# Patient Record
Sex: Female | Born: 1960 | Race: White | Hispanic: No | Marital: Married | Smoking: Never smoker
Health system: Southern US, Community
[De-identification: ages and names within clinical notes are randomized; demographics above are authoritative.]

## PROBLEM LIST (undated history)

## (undated) DIAGNOSIS — Z87442 Personal history of urinary calculi: Secondary | ICD-10-CM

## (undated) DIAGNOSIS — Z8489 Family history of other specified conditions: Secondary | ICD-10-CM

## (undated) HISTORY — PX: TONSILLECTOMY: SUR1361

## (undated) HISTORY — DX: Personal history of urinary calculi: Z87.442

## (undated) HISTORY — PX: LIPOMA EXCISION: SHX5283

---

## 2005-08-19 ENCOUNTER — Ambulatory Visit: Payer: Self-pay | Admitting: Obstetrics and Gynecology

## 2005-08-23 HISTORY — PX: CHOLECYSTECTOMY: SHX55

## 2008-05-16 ENCOUNTER — Ambulatory Visit: Payer: Self-pay | Admitting: Obstetrics and Gynecology

## 2010-06-13 LAB — HM MAMMOGRAPHY: HM Mammogram: NORMAL

## 2010-09-13 LAB — HM PAP SMEAR: HM Pap smear: NORMAL

## 2010-09-15 ENCOUNTER — Ambulatory Visit: Payer: Self-pay | Admitting: Obstetrics and Gynecology

## 2012-01-11 ENCOUNTER — Encounter: Payer: Self-pay | Admitting: Internal Medicine

## 2012-01-11 ENCOUNTER — Telehealth: Payer: Self-pay | Admitting: Internal Medicine

## 2012-01-11 ENCOUNTER — Ambulatory Visit (INDEPENDENT_AMBULATORY_CARE_PROVIDER_SITE_OTHER): Payer: PRIVATE HEALTH INSURANCE | Admitting: Internal Medicine

## 2012-01-11 VITALS — BP 112/70 | HR 80 | Temp 98.2°F | Resp 16 | Ht 65.0 in | Wt 213.8 lb

## 2012-01-11 DIAGNOSIS — R635 Abnormal weight gain: Secondary | ICD-10-CM

## 2012-01-11 DIAGNOSIS — E669 Obesity, unspecified: Secondary | ICD-10-CM

## 2012-01-11 DIAGNOSIS — Z803 Family history of malignant neoplasm of breast: Secondary | ICD-10-CM

## 2012-01-11 NOTE — Telephone Encounter (Signed)
MAMMOGRAM APPOINTMENT Thursday 01/13/12 @ 9 PT A[place]

## 2012-01-11 NOTE — Progress Notes (Signed)
Patient ID: Amy Oliver, female   DOB: 1961-03-15, 51 y.o.   MRN: [id number]  Patient Active Problem List  Diagnoses  . History of nephrolithiasis  . Obesity (BMI 30-39.9)    Subjective:  CC:   Chief Complaint  Patient presents with  . New Patient    HPI:   Amy Oliver a 51 y.o. female who presents as a new patient.  She is transferring from [facility] , her prior gynecologist.   Last PAP was Jan 2012,  Mammogram was Nov 2011.  She is interested in continuing breast screening but is requesting a thermography. She has a FH of BRCA in paternal GM at age 63, and maternal great aunt who  died at 57 of massive MI. History of cholecystectomy around 2009 by Michela Pitcher secondary to cholecystitis.   2  c sections ,  No complications. Tonsillectomy .  Brother developed DM at age 25 secondary to a viral illness, He is not overweight.  Her CC is weight gain despite a healthy diet and exercise.  She is working out 3 days per week .  The workout is 10 minutes with weights 3 days per week.   Diet is irregular.  She skips breakfast a lot .  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 . Lunch is [place], low fat cheese and fruit  Dinner is a protein and vegetable. Evening snack is ice cream    Past Medical History  Diagnosis Date  . History of nephrolithiasis     Past Surgical History  Procedure Date  . Cholecystectomy 2007         The following portions of the patient's history were reviewed and updated as appropriate: Allergies, current medications, and problem list.    Review of Systems:   12 Pt  review of systems was negative except those addressed in the HPI,     History   Social History  . Marital Status: Married    Spouse Name: N/A    Number of Children: N/A  . Years of Education: N/A   Occupational History  . executive Nature conservation officer   Social History Main Topics  . Smoking status: Never Smoker   . Smokeless tobacco: Never Used  . Alcohol Use: Yes   . Drug Use: No  . Sexually Active: Not on file   Other Topics Concern  . Not on file   Social History Narrative  . No narrative on file    Objective:  BP 112/70  Pulse 80  Temp(Src) 98.2 F (36.8 C) (Oral)  Resp 16  Ht 5\' 5"  (1.651 m)  Wt 213 lb 12 oz (96.956 kg)  BMI 35.57 kg/m2  SpO2 96%  LMP 12/24/2011  General appearance: alert, cooperative and appears stated age Ears: normal TM's and external ear canals both ears Throat: lips, mucosa, and tongue normal; teeth and gums normal Neck: no adenopathy, no carotid bruit, supple, symmetrical, trachea midline and thyroid not enlarged, symmetric, no tenderness/mass/nodules Back: symmetric, no curvature. ROM normal. No CVA tenderness. Lungs: clear to auscultation bilaterally Heart: regular rate and rhythm, S1, S2 normal, no murmur, click, rub or gallop Abdomen: soft, non-tender; bowel sounds normal; no masses,  no organomegaly Pulses: 2+ and symmetric Skin: Skin color, texture, turgor normal. No rashes or lesions Lymph nodes: Cervical, supraclavicular, and axillary nodes normal.  Assessment and Plan:  Obesity (BMI 30-39.9) I spent 20 minutes addressing her BMI and recommended a low glycemic index diet utilizing smaller more  frequent meals to increase metabolism.  I have also recommended that patient start exercising with a goal of 30 minutes of aerobic exercise a minimum of 5 days per week. Screening for lipid disorders, thyroid and diabetes to be done today.      Updated Medication List Outpatient Encounter Prescriptions as of 01/11/2012  Medication Sig Dispense Refill  . b complex vitamins tablet Take 1 tablet by mouth daily.      . Cholecalciferol (BIO-D-MULSION FORTE PO) Take by mouth.      . FIBER DIET PO Take by mouth.      . Omega-3 Fatty Acids (OMEGA 3 PO) Take by mouth.         Orders Placed This Encounter  Procedures  . HM MAMMOGRAPHY  . TSH  . HM PAP SMEAR    No Follow-up on file.

## 2012-01-11 NOTE — Patient Instructions (Signed)
Consider the Low Glycemic Index Diet and 6 smaller meals daily :   7 AM Low carbohydrate Protein  Shakes (EAS Carb Control  Or Atkins ,  Available everywhere,   In  cases at BJs )  2.5 carbs  (Add or substitute a toasted sandwhich thin w/ peanut butter) or scramble eggs burrito  10 AM: Protein bar by Atkins (snack size,  Chocolate lover's variety at  BJ's)    Lunch: sandwich on pita bread or flatbread (Joseph's makes a pita bread and a flat bread , available at [employer] and [employer]J's; Toufayah makes a low carb flatbread available at [employer] and [employer])  Mission makes a CarB Control Whole Wheat tortilla  (6 net carbs,  26 g fiber,  BJ's)   3 PM:  Mid day :  Another protein bar,  Or a  cheese stick, 1/4 cup of almonds, walnuts, pistachios, pecans, peanuts,  Macadamia nuts  6 PM  Dinner:  "mean and green:"  Meat/chicken/fish, salad, and green veggie : use ranch, vinagrette,  Blue cheese, etc  9 PM snack : Breyer's low carb fudgiscle or  ice cream bar (Carb Smart) Weight Watcher's ice cream bar , or another protein shake  make your substitutions < 15 carbs per serving

## 2012-01-12 ENCOUNTER — Encounter: Payer: Self-pay | Admitting: Internal Medicine

## 2012-01-12 DIAGNOSIS — Z87442 Personal history of urinary calculi: Secondary | ICD-10-CM | POA: Insufficient documentation

## 2012-01-12 DIAGNOSIS — Z803 Family history of malignant neoplasm of breast: Secondary | ICD-10-CM | POA: Insufficient documentation

## 2012-01-12 DIAGNOSIS — E669 Obesity, unspecified: Secondary | ICD-10-CM | POA: Insufficient documentation

## 2012-01-12 NOTE — Assessment & Plan Note (Signed)
She has no first degree relatives of BRCA an dis curious about thermography as a screening device. Spent 20 minutes in revies of the available literature about theraography compared to accepted modalities/

## 2012-01-12 NOTE — Assessment & Plan Note (Addendum)
I spent 20 minutes addressing her BMI and recommended a low glycemic index diet utilizing smaller more frequent meals to increase metabolism.  I have also recommended that patient start exercising with a goal of 30 minutes of aerobic exercise a minimum of 5 days per week. Screening for lipid disorders, thyroid and diabetes to be done today.

## 2012-01-12 NOTE — Progress Notes (Signed)
Addended by: Duncan Dull on: 01/12/2012 09:21 PM   Modules accepted: Level of Service

## 2012-01-25 ENCOUNTER — Encounter: Payer: Self-pay | Admitting: Internal Medicine

## 2012-02-11 ENCOUNTER — Ambulatory Visit: Payer: PRIVATE HEALTH INSURANCE | Admitting: Internal Medicine

## 2012-03-03 ENCOUNTER — Ambulatory Visit (INDEPENDENT_AMBULATORY_CARE_PROVIDER_SITE_OTHER): Payer: PRIVATE HEALTH INSURANCE | Admitting: Internal Medicine

## 2012-03-03 ENCOUNTER — Encounter: Payer: Self-pay | Admitting: Internal Medicine

## 2012-03-03 VITALS — BP 120/68 | HR 80 | Temp 97.9°F | Resp 18 | Wt 215.0 lb

## 2012-03-03 DIAGNOSIS — E669 Obesity, unspecified: Secondary | ICD-10-CM

## 2012-03-03 LAB — HM MAMMOGRAPHY: HM Mammogram: NORMAL

## 2012-03-03 NOTE — Patient Instructions (Addendum)
Consider a Low Glycemic Index Diet and eating 6 smaller meals daily .  This frequent feeding stimulates your metabolism and the lower glycemic index foods will lower your blood sugars:   This is an example of my daily  "Low GI"  Diet:  All of the foods can be found at grocery stores and in bulk at BJs  club   7 AM Breakfast:  Low carbohydrate Protein  Shakes (I recommend the EAS AdvantEdge "Carb Control" shakes  Or the low carb shakes by Atkins.   Both are available everywhere:  In  cases at BJs  Or in 4 packs at grocery stores and pharmacies  2.5 carbs  (Alternative is  a toasted Arnold's Sandwhich Thin w/ peanut butter, a "Bagel Thin" with cream cheese and salmon) or  a scrambled egg burrito made with a low carb tortilla .  Avoid cereal and bananas, oatmeal too unless the old fashioned kind that takes 30-40 minutes to prepare.  the rest is overly processed, has minimal fiber, and loaded with carbohydrates!   10 AM: Protein bar by Atkins (the snack size, under 200 cal.  There are many varieties , available widely again or in bulk in limited varieties at BJs)  Other so called "protein bars" tend to be loaded with carbohydrates.  Remember, in food advertising, the word "energy" is synonymous for " carbohydrate."  Lunch: sandwich of turkey, (or any lunchmeat or canned tuna), fresh avocado and cheese on a lower carbohydrate pita bread, flatbread, or tortilla . Ok to use mayonnaise. The bread is the only source or carbohydrate that can be decreased (Joseph's makes a pita bread and a flat bread  Are 50 cal and 4 net carbs ; Toufayan makes a low carb flatbread 100 cal and 9 net carbs  and  Mission makes a low carb whole wheat tortilla  210 cal and 6 net carbs)  3 PM:  Mid day :  Another proteintttt bThe brear,  Or a  cheese stick (100 cal, 0 carbs),  Or 1 ounce of  almonds, walnuts, pistachios, pecans, peanuts,  Macadamia nuts. Or a Dannon light n Fit greek yogurt, 80 cal 8 net carbs . Avoid "granola"; the  dried cranberries and raisins are loaded with carbohydrates.    6 PM  Dinner:  "mean and green:"  Meat/chicken/fish or a high protein legume; , with a green salad, and a low GI  Veggie (broccoli, cauliflower, green beans, spinach, brussel sprouts. Lima beans) : Avoid "Low fat dressings, [place] and [place]! They are loaded with sugar! Instead use ranch, vinagrette,  Blue cheese, etc  9 PM snack : Breyer's "low carb" fudgsicle or  ice cream bar (Carb Smart line), or  Weight Watcher's ice cream bar , or anouther "no sugar added" ice cream; or another protein shake or a serving of fresh fruit with whipped cream (Avoid bananas, pineapple, grapes  and watermelon on a regular basis because they are high in sugar)   Remember that snack Substitutions should be less than 15 to 20 carbs  Per serving. Remember to subtract fiber grams to get the "net carbs." 

## 2012-03-05 ENCOUNTER — Encounter: Payer: Self-pay | Admitting: Internal Medicine

## 2012-03-05 NOTE — Assessment & Plan Note (Addendum)
Most of this visit was spent in counseling on management of her obesity. Her diet is actually not loaded with junk food or inappropriate foods. She has not tried eating 6 small meals a day which I advocated for increasing her metabolic activity. I also recommend increasing her exercise to 5 days a week. Given her rise in LDL and hemoglobin A1c I did recommend that she follow a low glycemic index diet

## 2012-03-05 NOTE — Progress Notes (Signed)
Patient ID: Amy Oliver, female   DOB: 1960/12/28, 51 y.o.   MRN: [id number] Follow up on weight gain/obesity identified at initial visit. She has no history of diabetes, hyperlipidemia or hypothyroidism.  She brought with her today an Excel spread sheet listing  various metabolic tests that were run initially in January 2012 and again in June.  Especially was prepared by her brother who is a Land who has recommended various natural supplements to her over the years to manage her metabolic syndrome.   A1c had increased from 5.5-5.7.   she had normal liver enzymes,  normal CBC,  normal iron studies. her LDL has increased from 159-172 and her HDL has dropped from 54-46.  triglycerides were fine at 117. Thyroid function is normal at 1.75 but her brother thinks that that is considered low T3 and T4 were fine. Recess were unnecessary and therefore just distracting. She has not lost any weight since her last visit. She has been on vacation during summer that time and therefore was not making any dietary changes. She is not exercising regularly.  she has no history of joint pain, depression, or eating disorders.

## 2012-06-02 ENCOUNTER — Encounter: Payer: PRIVATE HEALTH INSURANCE | Admitting: Internal Medicine

## 2012-06-02 ENCOUNTER — Ambulatory Visit: Payer: PRIVATE HEALTH INSURANCE | Admitting: Internal Medicine

## 2013-07-03 ENCOUNTER — Ambulatory Visit: Payer: Self-pay | Admitting: Internal Medicine

## 2013-07-05 ENCOUNTER — Telehealth: Payer: Self-pay | Admitting: Internal Medicine

## 2013-07-05 DIAGNOSIS — R921 Mammographic calcification found on diagnostic imaging of breast: Secondary | ICD-10-CM | POA: Insufficient documentation

## 2013-07-05 NOTE — Telephone Encounter (Signed)
[facility]  has reported that Her mammogram was abnormal on the right bc of clacifications. .   She will be contacted to get additional films and an ultrasound the facility. If she has not heard from them ny tomorrow, let us know

## 2013-07-06 NOTE — Telephone Encounter (Signed)
Left message for patient to return call to office. 

## 2013-07-09 NOTE — Telephone Encounter (Signed)
Left message for patient to return call to office. 

## 2013-07-09 NOTE — Telephone Encounter (Signed)
Pt aware that she has an abnormal mammogram on the R side due to calcifications. She is aware [facility] will contact her directly to schedule those views, and to call us if she hasn't heard anything soon.

## 2013-07-12 ENCOUNTER — Ambulatory Visit: Payer: Self-pay | Admitting: Internal Medicine

## 2013-07-17 ENCOUNTER — Telehealth: Payer: Self-pay | Admitting: Emergency Medicine

## 2013-07-17 NOTE — Telephone Encounter (Signed)
Auth # [id number] valid for only 07/23/13.

## 2013-07-23 ENCOUNTER — Ambulatory Visit: Payer: Self-pay | Admitting: Internal Medicine

## 2013-07-26 ENCOUNTER — Telehealth: Payer: Self-pay | Admitting: *Deleted

## 2013-07-26 ENCOUNTER — Other Ambulatory Visit: Payer: Self-pay | Admitting: Internal Medicine

## 2013-07-26 DIAGNOSIS — N6099 Unspecified benign mammary dysplasia of unspecified breast: Secondary | ICD-10-CM

## 2013-07-26 NOTE — Telephone Encounter (Signed)
patient returned call and is speaking with dr Darrick Huntsman.

## 2013-07-26 NOTE — Telephone Encounter (Signed)
Radiologist from [facility] called to report pt's path report: Right breast biopsy, focal atypical ductal hyperplasia. Will need surgical referral. Have not received faxed report yet,

## 2013-07-26 NOTE — Telephone Encounter (Signed)
Left message on patient's cell line about needing to discuss path report and arrange surgical referral.  My preference will be [facility] Glimpse.

## 2013-07-27 ENCOUNTER — Telehealth: Payer: Self-pay | Admitting: Internal Medicine

## 2013-07-27 NOTE — Telephone Encounter (Signed)
Patient was imaged and biopsied same day by [facility].  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

## 2013-07-31 ENCOUNTER — Ambulatory Visit: Payer: BC Managed Care – PPO

## 2013-07-31 ENCOUNTER — Encounter: Payer: Self-pay | Admitting: General Surgery

## 2013-07-31 ENCOUNTER — Ambulatory Visit (INDEPENDENT_AMBULATORY_CARE_PROVIDER_SITE_OTHER): Payer: BC Managed Care – PPO | Admitting: General Surgery

## 2013-07-31 VITALS — BP 122/68 | HR 84 | Resp 16 | Ht 66.0 in | Wt 216.0 lb

## 2013-07-31 DIAGNOSIS — Z9889 Other specified postprocedural states: Secondary | ICD-10-CM | POA: Insufficient documentation

## 2013-07-31 DIAGNOSIS — N6099 Unspecified benign mammary dysplasia of unspecified breast: Secondary | ICD-10-CM | POA: Insufficient documentation

## 2013-07-31 DIAGNOSIS — R928 Other abnormal and inconclusive findings on diagnostic imaging of breast: Secondary | ICD-10-CM

## 2013-07-31 DIAGNOSIS — N62 Hypertrophy of breast: Secondary | ICD-10-CM

## 2013-07-31 NOTE — Progress Notes (Signed)
Patient ID: Amy Oliver, female   DOB: 1961/07/13, 52 y.o.   MRN: [id number]  Chief Complaint  Patient presents with  . Other    abnormal mammogram    HPI Amy Oliver is a 52 y.o. female who presents for a breast evaluation. The most recent mammogram, ultrasound, and breast biopsy was done on 07/26/13 at [facility]. Patient does not perform regular self breast checks but does get regular mammograms done.  The patient denies any problems with her breasts at this time.    HPI  Past Medical History  Diagnosis Date  . History of nephrolithiasis     Past Surgical History  Procedure Laterality Date  . Cholecystectomy  2007  . Tonsillectomy    . Cesarean section      2    Family History  Problem Relation Age of Onset  . Hypertension Father   . Pulmonary fibrosis Father   . Diabetes Brother   . Lung cancer Paternal Aunt   . Breast cancer Paternal Grandmother   . Breast cancer Maternal Aunt     Social History History  Substance Use Topics  . Smoking status: Never Smoker   . Smokeless tobacco: Never Used  . Alcohol Use: Yes    Allergies  Allergen Reactions  . Penicillins Rash    Current Outpatient Prescriptions  Medication Sig Dispense Refill  . Difluprednate (DUREZOL) 0.05 % EMUL Apply to eye.      Marland Kitchen VITAMIN D, ERGOCALCIFEROL, PO Take by mouth.       No current facility-administered medications for this visit.    Review of Systems Review of Systems  Constitutional: Negative.   Respiratory: Negative.   Cardiovascular: Negative.     Blood pressure 122/68, pulse 84, resp. rate 16, height 5\' 6"  (1.676 m), weight 216 lb (97.977 kg), last menstrual period 07/04/2013.  Physical Exam Physical Exam  Constitutional: She is oriented to person, place, and time. She appears well-developed and well-nourished.  Neck: No thyromegaly present.  Cardiovascular: Normal rate, regular rhythm and normal heart sounds.   Pulmonary/Chest: Effort normal and breath sounds  normal. Right breast exhibits no inverted nipple, no mass, no nipple discharge, no skin change and no tenderness. Left breast exhibits no inverted nipple, no mass, no nipple discharge, no skin change and no tenderness.  Lymphadenopathy:    She has no cervical adenopathy.    She has no axillary adenopathy.  Neurological: She is alert and oriented to person, place, and time.  Skin: Skin is warm and dry.    Data Reviewed Screening mammograms on July 04, 2011 suggested a new density in the upper quadrant of the right breast for which additional views were requested. BI-RAD-0.  Focal spot compression views of the right breast is July 12, 2013 showed a focal area of increased microcalcifications in the right breast. BI-RAD-4.  July 23, 2013 stereotactic biopsy completed by the radiologist was completed. Biopsy showed atypical hyperplasia.  Focal ultrasound examination of the right breast completed today shows a superficially located 0.7 x 0.7 x 0.8 ill-defined hypoechoic area with evidence of a biopsy clip in the 10:00 position of the right breast 7 cm from the nipple.  Assessment    Atypical hyperplasia of the right breast.     Plan    The 1/6 chance of an additional lesion being identified on wide excision (DCIS which were likely than invasive malignancy) was reviewed with the patient. Indication for wide local excision was discussed. This can be completed under ultrasound  guidance in the next few weeks, otherwise needle localization will be required.  The risks of surgery including those related to bleeding and infection were discussed.  The patient is interested in alternative medicine, but is amenable to proceed to wide local excision.     Patient's surgery has been scheduled for 08-09-13 at [facility].   Amy Oliver 07/31/2013, 9:13 PM

## 2013-07-31 NOTE — Patient Instructions (Addendum)
Patient to be scheduled for right breast excision.   This patient's surgery has been scheduled for 08-09-13 at [facility].

## 2013-08-01 ENCOUNTER — Encounter: Payer: Self-pay | Admitting: Internal Medicine

## 2013-08-06 ENCOUNTER — Encounter: Payer: Self-pay | Admitting: Internal Medicine

## 2013-08-08 ENCOUNTER — Encounter: Payer: Self-pay | Admitting: Internal Medicine

## 2013-08-08 ENCOUNTER — Other Ambulatory Visit: Payer: Self-pay | Admitting: General Surgery

## 2013-08-08 DIAGNOSIS — N6099 Unspecified benign mammary dysplasia of unspecified breast: Secondary | ICD-10-CM

## 2013-08-09 ENCOUNTER — Ambulatory Visit: Payer: Self-pay | Admitting: General Surgery

## 2013-08-09 DIAGNOSIS — N6089 Other benign mammary dysplasias of unspecified breast: Secondary | ICD-10-CM

## 2013-08-09 HISTORY — PX: BREAST SURGERY: SHX581

## 2013-08-13 ENCOUNTER — Encounter: Payer: Self-pay | Admitting: General Surgery

## 2013-08-14 ENCOUNTER — Encounter: Payer: Self-pay | Admitting: General Surgery

## 2013-08-14 ENCOUNTER — Telehealth: Payer: Self-pay | Admitting: General Surgery

## 2013-08-14 NOTE — Telephone Encounter (Signed)
The pathology report from the patient's 08/09/2013 biopsy became available today. In both sections of breast tissue removed a foci of atypical ductal hyperplasia with changes consistent with needle tracts were identified. The margins were clear. One 2 mm inframammary node was included in the specimen. The biopsy site and clip had not been removed at the time of the procedure, and the patient and her husband were aware of this.  The finding of 2 additional microscopic foci of ADH makes it important to confirm that the original area identified on mammographic imaging does not represent a more sinister process. The plan is for needle localization and excision she is comfortable post biopsy has been discussed.  The patient will return on August 22, 2013 for her postop exam and to review options for management.

## 2013-08-22 ENCOUNTER — Encounter: Payer: Self-pay | Admitting: General Surgery

## 2013-08-22 ENCOUNTER — Ambulatory Visit (INDEPENDENT_AMBULATORY_CARE_PROVIDER_SITE_OTHER): Payer: BC Managed Care – PPO | Admitting: General Surgery

## 2013-08-22 VITALS — BP 126/74 | HR 84 | Resp 14 | Ht 66.0 in | Wt 212.0 lb

## 2013-08-22 DIAGNOSIS — N6099 Unspecified benign mammary dysplasia of unspecified breast: Secondary | ICD-10-CM

## 2013-08-22 DIAGNOSIS — N62 Hypertrophy of breast: Secondary | ICD-10-CM

## 2013-08-22 NOTE — Patient Instructions (Signed)
Follow up appointment to be announced.  

## 2013-08-22 NOTE — Progress Notes (Signed)
Patient ID: Amy Oliver, female   DOB: 06/10/1961, 52 y.o.   MRN: [id number]  Chief Complaint  Patient presents with  . Routine Post Op    right breast biopsy    HPI Amy Oliver is a 52 y.o. female here today for her post op right breast biopsy done on 08/09/13. The patient is accompanied by her husband. She reports a little discomfort post biopsy. She had previously been notified of the biopsy results.  HPI  Past Medical History  Diagnosis Date  . History of nephrolithiasis     Past Surgical History  Procedure Laterality Date  . Cholecystectomy  2007  . Tonsillectomy    . Cesarean section      2    Family History  Problem Relation Age of Onset  . Hypertension Father   . Pulmonary fibrosis Father   . Diabetes Brother   . Lung cancer Paternal Aunt   . Breast cancer Paternal Grandmother   . Breast cancer Maternal Aunt     Social History History  Substance Use Topics  . Smoking status: Never Smoker   . Smokeless tobacco: Never Used  . Alcohol Use: Yes    Allergies  Allergen Reactions  . Penicillins Rash    Current Outpatient Prescriptions  Medication Sig Dispense Refill  . Difluprednate (DUREZOL) 0.05 % EMUL Apply to eye.      Marland Kitchen VITAMIN D, ERGOCALCIFEROL, PO Take by mouth.       No current facility-administered medications for this visit.    Review of Systems Review of Systems  Constitutional: Negative.   Respiratory: Negative.   Cardiovascular: Negative.     Blood pressure 126/74, pulse 84, resp. rate 14, height 5\' 6"  (1.676 m), weight 212 lb (96.163 kg), last menstrual period 07/04/2013.  Physical Exam Physical Exam  Constitutional: She is oriented to person, place, and time. She appears well-developed and well-nourished.  Eyes: No scleral icterus.  Cardiovascular: Normal rate, regular rhythm and normal heart sounds.   Pulmonary/Chest: Breath sounds normal. Right breast exhibits no inverted nipple, no mass, no nipple discharge, no  skin change and no tenderness.  Right breast incision looks clean and healing well.  Abdominal: Bowel sounds are normal.  Lymphadenopathy:    She has no cervical adenopathy.    She has no axillary adenopathy.  Neurological: She is alert and oriented to person, place, and time.  Skin: Skin is warm and dry.    Data Reviewed Original biopsy showed ADH. 2 samples of breast parenchyma were removed at the time of her operative biopsy, each showed foci of ADH. The original biopsy site was not included in the resected tissue.  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     Plan    The patient will require needle localization and biopsy of the original site to confirm no upstaging the DCIS. If ADH alone as identified breast MRI in 6 months will be recommended to confirm no other areas not obvious on mammogram are of concern. (2 areas removed at operative biopsy had not shown any associated mammographic abnormality on retrospective review of the films.)   If DCIS or invasive cancer is identified, it will require at least a minimum of 2 months for inflammatory changes related to the biopsy process to resolve to minimize obscuring any small areas on MRI imaging.  The patient is aware that the original biopsy site was not removed, and this was because what I thought represented the biopsy  cavity an ultrasound did not correlate with the operative findings. The opportunity to have her care transferred to another physician locally or at the [facility] (she has a relative who works at [facility]) was discussed.  One she is comfortable enough to tolerate a repeat mammogram, will arrange for needle localization and reexcision.       Amy Oliver 08/24/2013, 3:33 PM

## 2013-09-03 LAB — PATHOLOGY REPORT

## 2013-09-07 ENCOUNTER — Telehealth: Payer: Self-pay | Admitting: Internal Medicine

## 2013-09-11 ENCOUNTER — Other Ambulatory Visit (HOSPITAL_COMMUNITY)
Admission: RE | Admit: 2013-09-11 | Discharge: 2013-09-11 | Disposition: A | Discharge status: Home / Self Care | Payer: BC Managed Care – PPO | Source: Ambulatory Visit | Attending: Internal Medicine | Admitting: Internal Medicine

## 2013-09-11 ENCOUNTER — Ambulatory Visit (INDEPENDENT_AMBULATORY_CARE_PROVIDER_SITE_OTHER): Payer: BC Managed Care – PPO | Admitting: Internal Medicine

## 2013-09-11 ENCOUNTER — Encounter: Payer: Self-pay | Admitting: Internal Medicine

## 2013-09-11 VITALS — BP 124/76 | HR 93 | Temp 98.5°F | Resp 16 | Ht 64.5 in | Wt 212.5 lb

## 2013-09-11 DIAGNOSIS — R921 Mammographic calcification found on diagnostic imaging of breast: Secondary | ICD-10-CM

## 2013-09-11 DIAGNOSIS — Z9889 Other specified postprocedural states: Secondary | ICD-10-CM

## 2013-09-11 DIAGNOSIS — H2000 Unspecified acute and subacute iridocyclitis: Secondary | ICD-10-CM

## 2013-09-11 DIAGNOSIS — E785 Hyperlipidemia, unspecified: Secondary | ICD-10-CM

## 2013-09-11 DIAGNOSIS — Z01419 Encounter for gynecological examination (general) (routine) without abnormal findings: Secondary | ICD-10-CM | POA: Insufficient documentation

## 2013-09-11 DIAGNOSIS — Z124 Encounter for screening for malignant neoplasm of cervix: Secondary | ICD-10-CM

## 2013-09-11 DIAGNOSIS — Z0000 Encounter for general adult medical examination without abnormal findings: Secondary | ICD-10-CM | POA: Insufficient documentation

## 2013-09-11 DIAGNOSIS — R928 Other abnormal and inconclusive findings on diagnostic imaging of breast: Secondary | ICD-10-CM

## 2013-09-11 DIAGNOSIS — E669 Obesity, unspecified: Secondary | ICD-10-CM

## 2013-09-11 DIAGNOSIS — Z1211 Encounter for screening for malignant neoplasm of colon: Secondary | ICD-10-CM

## 2013-09-11 DIAGNOSIS — Z1151 Encounter for screening for human papillomavirus (HPV): Secondary | ICD-10-CM | POA: Insufficient documentation

## 2013-09-11 NOTE — Progress Notes (Signed)
Patient ID: Amy Oliver, female   DOB: 03-Jul-1961, 53 y.o.   MRN: [id number]       Subjective:     Amy Oliver is a 53 y.o. female and is here for a comprehensive physical exam. The patient reports several problems.  History   Social History  . Marital Status: Married    Spouse Name: N/A    Number of Children: N/A  . Years of Education: N/A   Occupational History  . executive Insurance underwriter   Social History Main Topics  . Smoking status: Never Smoker   . Smokeless tobacco: Never Used  . Alcohol Use: Yes  . Drug Use: No  . Sexual Activity: Not on file   Other Topics Concern  . Not on file   Social History Narrative  . No narrative on file   Health Maintenance  Topic Date Due  . Tetanus/tdap  05/03/1982  . Influenza Vaccine  03/23/2013  . Colonoscopy  05/03/2013  . Pap Smear  09/13/2013  . Mammogram  07/13/2015    The following portions of the patient's history were reviewed and updated as appropriate: allergies, current medications, past family history, past medical history, past social history, past surgical history and problem list.  Review of Systems A comprehensive review of systems was negative.   Objective:   BP 124/76  Pulse 93  Temp(Src) 98.5 F (36.9 C) (Oral)  Resp 16  Ht 5' 4.5" (1.638 m)  Wt 212 lb 8 oz (96.389 kg)  BMI 35.93 kg/m2  SpO2 98%  LMP 09/01/2013  General Appearance:    Alert, cooperative, no distress, appears stated age  Head:    Normocephalic, without obvious abnormality, atraumatic  Eyes:    PERRL, conjunctiva/corneas clear, EOM's intact, fundi    benign, both eyes  Ears:    Normal TM's and external ear canals, both ears  Nose:   Nares normal, septum midline, mucosa normal, no drainage    or sinus tenderness  Throat:   Lips, mucosa, and tongue normal; teeth and gums normal  Neck:   Supple, symmetrical, trachea midline, no adenopathy;    thyroid:  no enlargement/tenderness/nodules; no  carotid   bruit or JVD  Back:     Symmetric, no curvature, ROM normal, no CVA tenderness  Lungs:     Clear to auscultation bilaterally, respirations unlabored  Chest Wall:    No tenderness or deformity   Heart:    Regular rate and rhythm, S1 and S2 normal, no murmur, rub   or gallop  Breast Exam:    Right breast incision 99% epithelialized,  With granulating tissue at inferolateral edge.  Breast is tender to palpation.  No   masses, or nipple abnormality  Abdomen:     Soft, non-tender, bowel sounds active all four quadrants,    no masses, no organomegaly  Genitalia:    Normal female without lesion.  Thick white vaginal discharge noted ,  No CMT or andexal tenderenor tenderness  Rectal:    deferred    Extremities:   Extremities normal, atraumatic, no cyanosis or edema  Pulses:   2+ and symmetric all extremities  Skin:   Skin color, texture, turgor normal, no rashes or lesions  Lymph nodes:   Cervical, supraclavicular, and axillary nodes normal  Neurologic:   CNII-XII intact, normal strength, sensation and reflexes    throughout    Assessment and Plan:   Breast calcifications on mammogram She underwent breast biopsy of  atypical calcifications in the right breast but will need a second biopsy to target the original calcs seen on mammogram.  She is having a second eval at [facility] per sister's request in 2 weeks.    S/P breast biopsy, right She is having serous drainage without erythema from the inferolateral border of prior biopsy site, the area is granulating but not epithelialized .  Does not appear infected.  Will consider 7 days of keflex   Encounter for preventive health examination Annual comprehensive exam was done including breast, pelvic and PAP smear. All screenings have been addressed .given her ongoing breast issues, she has opted for FOBTS in lieu of screening colonoscopy this year.   Obesity (BMI 30-39.9) I have addressed  BMI and recommended wt loss of 10% of body weigh over the  next 6 months using a low glycemic index diet and regular exercise a minimum of 5 days per week.    HLA-B27 associated acute anterior uveitis Suspected by recent outside labs.. Records from [facility] requested/    Updated Medication List Outpatient Encounter Prescriptions as of 09/11/2013  Medication Sig  . Cholecalciferol (BIO-D-MULSION PO) Take 2 drops by mouth daily.  . Difluprednate (DUREZOL) 0.05 % EMUL Apply to eye.  Marland Kitchen VITAMIN D, ERGOCALCIFEROL, PO Take by mouth.

## 2013-09-11 NOTE — Assessment & Plan Note (Signed)
She is having serous drainage without erythema from the inferolateral border of prior biopsy site, the area is granulating but not epithelialized .  Does not appear infected.  Will consider 7 days of keflex

## 2013-09-11 NOTE — Assessment & Plan Note (Signed)
Suspected by recent outside labs.. Records from [facility] requested/

## 2013-09-11 NOTE — Assessment & Plan Note (Signed)
Annual comprehensive exam was done including breast, pelvic and PAP smear. All screenings have been addressed .given her ongoing breast issues, she has opted for FOBTS in lieu of screening colonoscopy this year.

## 2013-09-11 NOTE — Assessment & Plan Note (Signed)
I have addressed  BMI and recommended wt loss of 10% of body weigh over the next 6 months using a low glycemic index diet and regular exercise a minimum of 5 days per week.   

## 2013-09-11 NOTE — Assessment & Plan Note (Signed)
She underwent breast biopsy of atypical calcifications in the right breast but will need a second biopsy to target the original calcs seen on mammogram.  She is having a second eval at [facility] per sister's request in 2 weeks.

## 2013-09-12 ENCOUNTER — Telehealth: Payer: Self-pay | Admitting: *Deleted

## 2013-09-12 ENCOUNTER — Ambulatory Visit (INDEPENDENT_AMBULATORY_CARE_PROVIDER_SITE_OTHER): Payer: BC Managed Care – PPO | Admitting: *Deleted

## 2013-09-12 DIAGNOSIS — N62 Hypertrophy of breast: Secondary | ICD-10-CM

## 2013-09-12 DIAGNOSIS — N6099 Unspecified benign mammary dysplasia of unspecified breast: Secondary | ICD-10-CM

## 2013-09-12 LAB — WET PREP BY MOLECULAR PROBE
Candida species: NEGATIVE
Gardnerella vaginalis: NEGATIVE
TRICHOMONAS VAG: NEGATIVE

## 2013-09-12 NOTE — Telephone Encounter (Signed)
Message copied by Carson Myrtle on Wed Sep 12, 2013 10:21 AM ------      Message from: Erwin, Ulster W      Created: Wed Sep 12, 2013  8:28 AM       Notify the patient I received a note from Dr. Derrel Nip about some wound healing issues. Glad to look at it today if she desires.      If she is not interested, that is fine.  She is going to [facility] for an second opinion re: repeat biopsy, ask her to please have them send a copy of their evaluation to me.  ------

## 2013-09-12 NOTE — Telephone Encounter (Signed)
appt 200 with nurse

## 2013-09-12 NOTE — Progress Notes (Signed)
States there is a oozing area right breast biopsy site from 08-08-13.  States it doesn't happen daily, and it seems to be clear/tan. She states her nipple is tender/sensitive. [facility] appt is for Monday 09-17-13.

## 2013-09-12 NOTE — Patient Instructions (Addendum)
Continue self breast exams. Call office for any new breast issues or concerns. Use heating pad as needed for comfort. Continue to wear bra as needed for comfort.

## 2013-09-14 ENCOUNTER — Encounter: Payer: Self-pay | Admitting: *Deleted

## 2013-09-28 ENCOUNTER — Ambulatory Visit (INDEPENDENT_AMBULATORY_CARE_PROVIDER_SITE_OTHER): Payer: BC Managed Care – PPO | Admitting: *Deleted

## 2013-09-28 ENCOUNTER — Other Ambulatory Visit (INDEPENDENT_AMBULATORY_CARE_PROVIDER_SITE_OTHER): Payer: BC Managed Care – PPO

## 2013-09-28 DIAGNOSIS — Z23 Encounter for immunization: Secondary | ICD-10-CM

## 2013-09-28 DIAGNOSIS — E785 Hyperlipidemia, unspecified: Secondary | ICD-10-CM

## 2013-09-28 LAB — LIPID PANEL
CHOL/HDL RATIO: 7
CHOLESTEROL: 275 mg/dL — AB (ref 0–200)
HDL: 40.3 mg/dL (ref >39.00)
TRIGLYCERIDES: 117 mg/dL (ref 0.0–149.0)
VLDL: 23.4 mg/dL (ref 0.0–40.0)

## 2013-09-28 LAB — COMPREHENSIVE METABOLIC PANEL
ALT: 16 U/L (ref 0–35)
AST: 19 U/L (ref 0–37)
Albumin: 4 g/dL (ref 3.5–5.2)
Alkaline Phosphatase: 61 U/L (ref 39–117)
BUN: 13 mg/dL (ref 6–23)
CALCIUM: 9.2 mg/dL (ref 8.4–10.5)
CO2: 26 mEq/L (ref 19–32)
CREATININE: 0.8 mg/dL (ref 0.4–1.2)
Chloride: 107 mEq/L (ref 96–112)
GFR: 77.21 mL/min (ref >60.00)
Glucose, Bld: 89 mg/dL (ref 70–99)
Potassium: 4.5 mEq/L (ref 3.5–5.1)
Sodium: 138 mEq/L (ref 135–145)
TOTAL PROTEIN: 7.3 g/dL (ref 6.0–8.3)
Total Bilirubin: 0.7 mg/dL (ref 0.3–1.2)

## 2013-09-28 LAB — TSH: TSH: 1.13 u[IU]/mL (ref 0.35–5.50)

## 2013-10-01 LAB — LDL CHOLESTEROL, DIRECT: Direct LDL: 226.5 mg/dL

## 2013-10-02 ENCOUNTER — Encounter: Payer: Self-pay | Admitting: *Deleted

## 2013-10-09 ENCOUNTER — Telehealth: Payer: Self-pay | Admitting: *Deleted

## 2013-10-09 NOTE — Telephone Encounter (Signed)
Message copied by Carson Myrtle on Tue Oct 09, 2013 12:52 PM ------      Message from: Robert Bellow      Created: Fri Oct 05, 2013 11:47 AM       Patient was to be seen at [facility] on Jan 26th.. Interested to know what they recommended.  ------

## 2013-10-09 NOTE — Telephone Encounter (Signed)
Events related to her recent [facility] evaluation were discussed.  Repeat mammograms completed at that facility did not show residual calcifications. (Calcifications were the original indication for biopsy completed by the radiology service at [facility]).  The patient was advised to tamoxifen is chemoprevention would be appropriate, and the physician who evaluated the patient [facility] did not feel that reexcision to excise the previously placed clip was necessary at this time.  The patient is yet to fill her prescription. She is premenopausal. 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  At present the patient is scheduled for bilateral mammograms in 6 months at [facility]. She was encouraged to contact the facility so that the procedure/mammograms could be completed after local outpatient facility avoiding an unnecessary trip to [facility].  The patient will contact this office if we can be of any assistance.

## 2013-10-09 NOTE — Telephone Encounter (Signed)
I talked with the patient and she said that they did a repeat mammogram, clip remains, no additional surgery needed.  Recommend repeat mammogram in 6 months and tamoxifen. Her risk was calculated to be 21%.  She gave them our card and would like for Dr Bary Castilla to review the repeat mammogram that was done. Dr Bary Castilla was able to speak to patient on the phone as well.

## 2014-05-02 LAB — LIPID PANEL
CHOLESTEROL: 244 mg/dL — AB (ref 0–200)
HDL: 41 mg/dL (ref 35–70)
LDL Cholesterol: 170 mg/dL
Triglycerides: 113 mg/dL (ref 40–160)

## 2014-05-02 LAB — BASIC METABOLIC PANEL
BUN: 14 mg/dL (ref 4–21)
CREATININE: 0.9 mg/dL (ref 0.5–1.1)
Potassium: 5 mmol/L (ref 3.4–5.3)
Sodium: 140 mmol/L (ref 137–147)

## 2014-05-02 LAB — CBC AND DIFFERENTIAL
HEMATOCRIT: 42 % (ref 36–46)
Hemoglobin: 13.7 g/dL (ref 12.0–16.0)
PLATELETS: 389 10*3/uL (ref 150–399)
WBC: 6.6 10*3/mL

## 2014-05-02 LAB — HEPATIC FUNCTION PANEL
ALK PHOS: 59 U/L (ref 25–125)
ALT: 10 U/L (ref 7–35)
AST: 14 U/L (ref 13–35)
Bilirubin, Total: 0.3 mg/dL

## 2014-05-02 LAB — TSH: TSH: 2.6 u[IU]/mL (ref 0.41–5.90)

## 2014-05-05 ENCOUNTER — Telehealth: Payer: Self-pay | Admitting: Internal Medicine

## 2014-05-05 NOTE — Telephone Encounter (Signed)
Your vitamin D, thyroid , liver and kidney function are normal.  Your cholesterol is elevated,  LDL is 170,  Other components of lipid profile are fine  .   New ACC guidelines recommend starting patients aged 51 or higher on moderate intensity statin therapy for LDL between 70-189 and 10 yr risk of CAD > 7.5% ;  Please make appt to discuss   Regards,   Dr. Derrel Nip

## 2014-05-07 ENCOUNTER — Encounter: Payer: Self-pay | Admitting: *Deleted

## 2014-05-07 NOTE — Telephone Encounter (Signed)
Letter mailed

## 2014-06-24 ENCOUNTER — Encounter: Payer: Self-pay | Admitting: Internal Medicine

## 2014-09-21 ENCOUNTER — Encounter: Payer: Self-pay | Admitting: General Surgery

## 2014-09-21 NOTE — Progress Notes (Signed)
The patient has had follow-up mammograms completed through the [facility] system, the most recent bilateral exam in 07/22/2014. No new microcalcifications. BI-RADS-2.  Oh record of whether the patient was treated with antiestrogen therapy as would typically be indicated with a high risk lesion such as ADH.

## 2014-12-13 NOTE — Op Note (Signed)
PATIENT NAME:  Amy Oliver, Amy Oliver MR#:  [id number] DATE OF BIRTH:  1961-01-04  DATE OF PROCEDURE:  08/09/2013  DATE OF PROCEDURE: 08/09/2013.   PREOPERATIVE DIAGNOSIS: Atypical ductal hyperplasia of right breast.   POSTOPERATIVE DIAGNOSIS:  Atypical ductal hyperplasia of right breast.  OPERATIVE PROCEDURE: Right breast biopsy.   SURGEON: Hervey Ard.   ANESTHESIA:  General by LMA, Marcaine 0.5% with 1:200,000 units of epinephrine, 20 mL local infiltration.   ESTIMATED BLOOD LOSS: 10 mL.   CLINICAL NOTE: This 54 year old woman underwent a stereotactic biopsy for a mammographic abnormality suggesting atypical ductal hyperplasia. An office ultrasound had suggested that the previously placed biopsy clip was clearly evident in the 10 o'clock position of the left breast, 7 cm from the nipple, and she was felt to be a candidate for ultrasound-guided biopsy.   OPERATIVE NOTE: The patient under adequate general anesthesia. The area was prepped with ChloraPrep and draped. Marcaine was infiltrated for postoperative analgesia. Ultrasound was brought onto the field in a sterile fashion, in the previously identified location below the biopsy entry site was identified, and the biopsy tract to be followed down to the adipose tissue. A smooth, curvilinear incision in the upper outer quadrant of the breast was carried down at the skin and subcutaneous tissue after instillation of local anesthetic. The skin was incised sharply and the remaining dissection completed with electrocautery. Ultrasound showed a fairly superficial location, and beginning about 1 cm below the skin, near the junction of the adipose tissue and the underlying breast parenchyma, a 3 x 4 x 4 cm block of tissue extending down to the pectoralis fascia was excised. Specimen radiograph did not confirm the previously placed clip. An additional 2 cm section medial to this biopsy was excised, and again specimen radiograph was negative. At this point,  it was elected to discontinue further resection and to await for permanent pathology and likely return to the operating room with needle localization of the previously placed clip. Hemostasis was with electrocautery. The tissue was approximated in layers with interrupted 2-0 Vicryl figure-of-eight sutures. The skin was closed with a running 4-0 Vicryl subcuticular suture. Benzoin and Steri-Strips were applied, followed by fluff gauze, Kerlix and an Ace wrap.   The patient and her husband were both advised that the biopsy clip had not been removed at the time of the procedure, and that further decisions regarding management will take place after final pathology is received.     ____________________________ Robert Bellow, MD jwb:dmm D: 08/09/2013 20:11:11 ET T: 08/09/2013 20:33:25 ET JOB#: [id number]  cc: Robert Bellow, MD, <Dictator> Deborra Medina, MD Deirdra Heumann Amedeo Kinsman MD ELECTRONICALLY SIGNED 08/10/2013 22:10

## 2015-07-06 IMAGING — MG MM DIGITAL SCREENING BILAT W/ CAD
1 series · 5 of 5 positions shown · non-contrast
Comparison: Previous Exam(s)

CLINICAL DATA: Screening.

EXAM:
DIGITAL SCREENING BILATERAL MAMMOGRAM WITH CAD

[R CC · right · 5 of 5 slices shown]
[im 1/5]
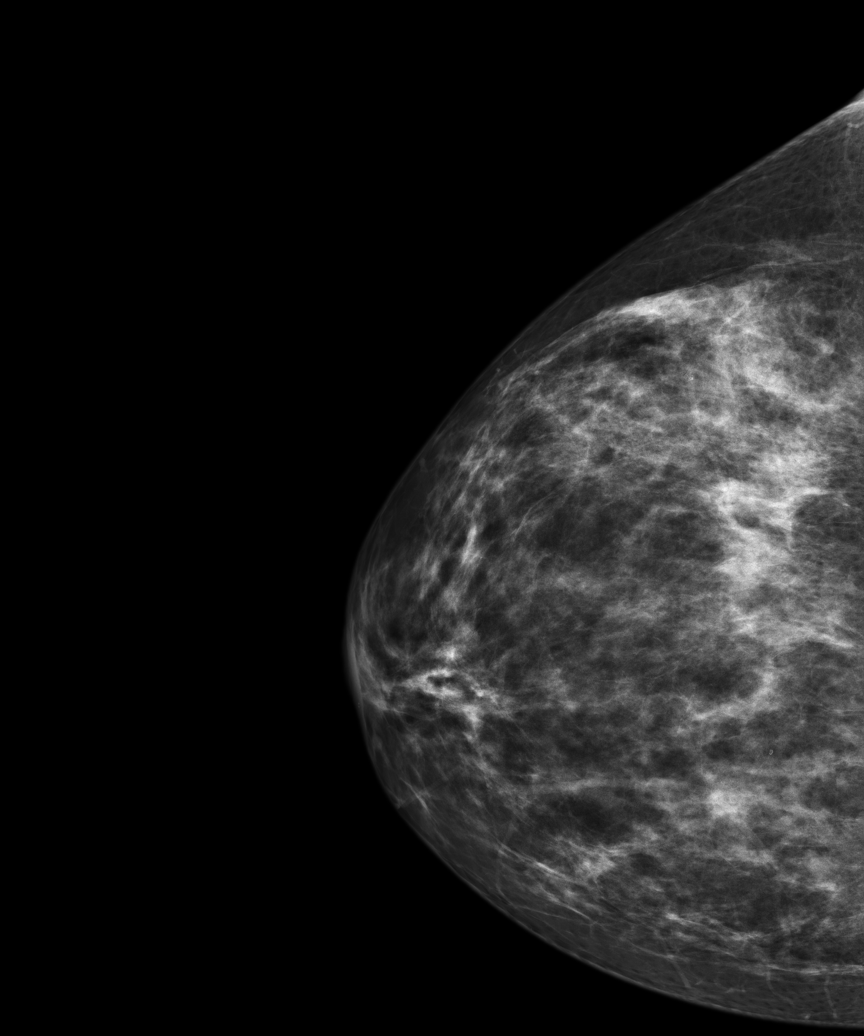
[im 2/5]
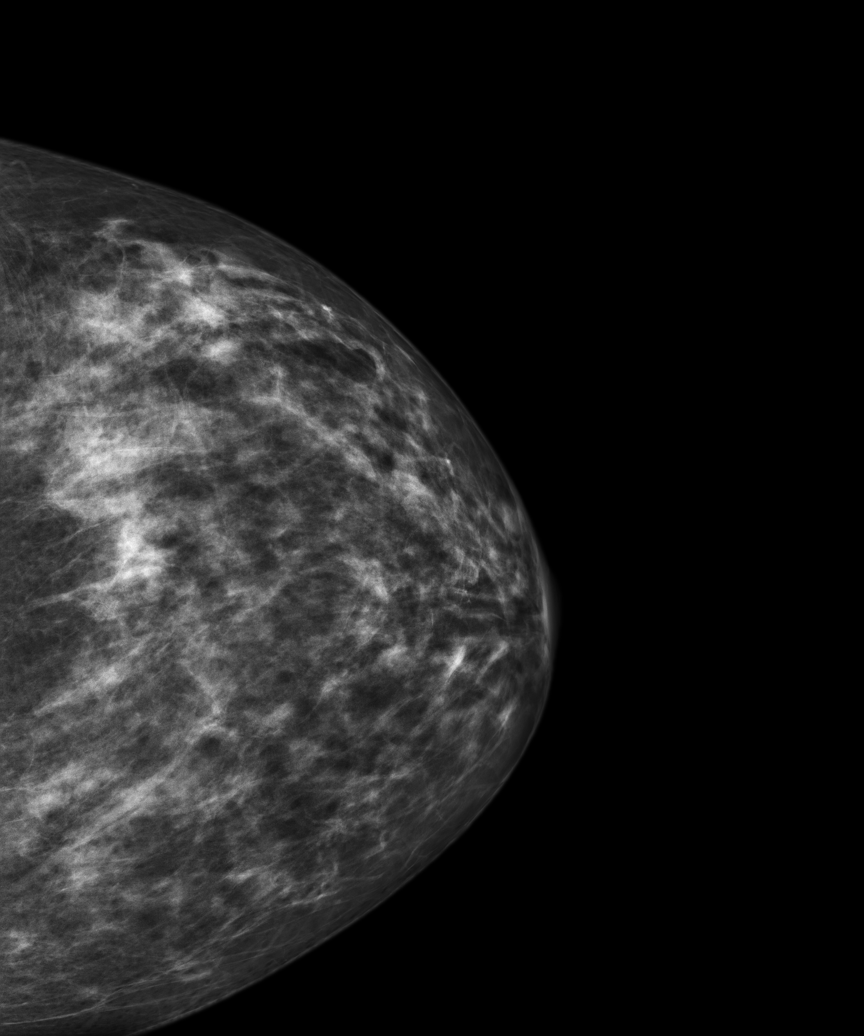
[im 3/5]
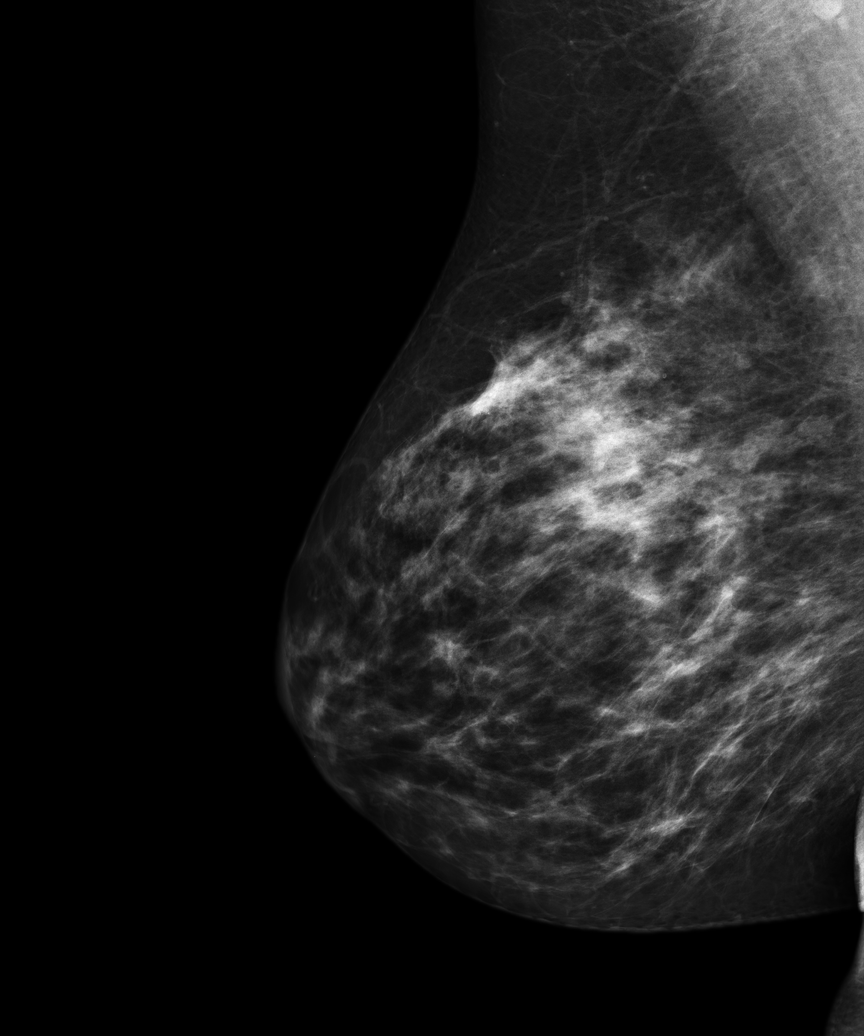
[im 4/5]
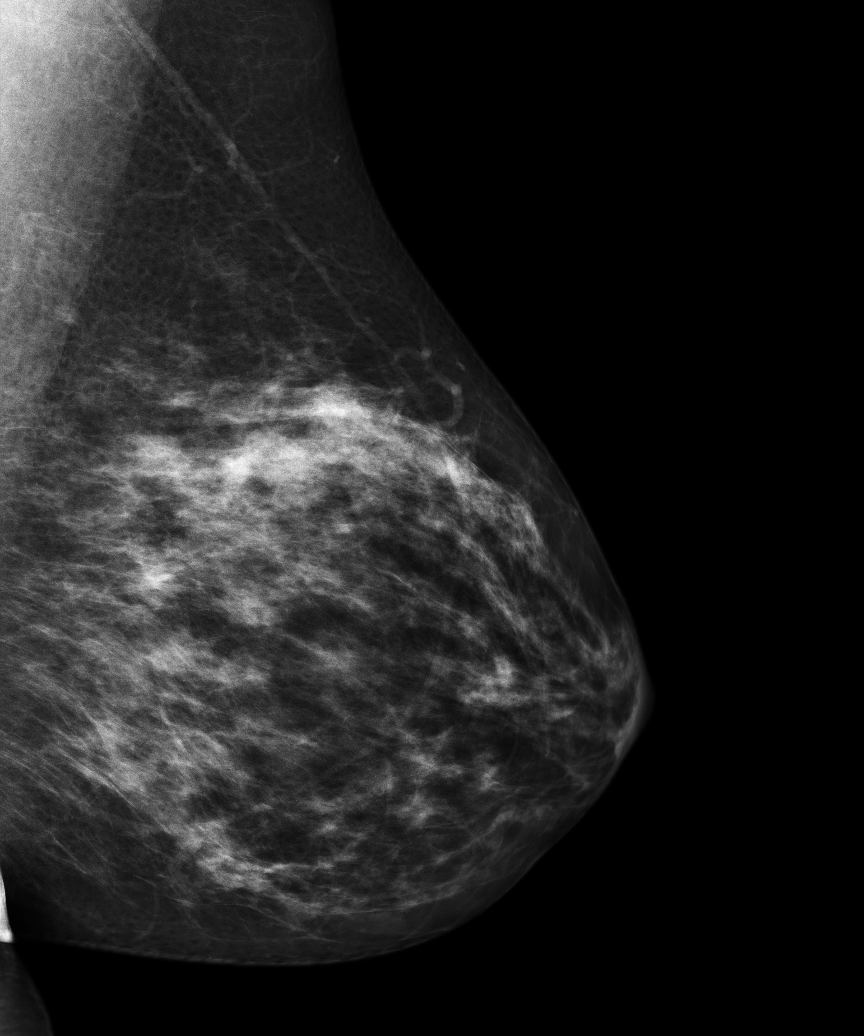
[im 5/5]
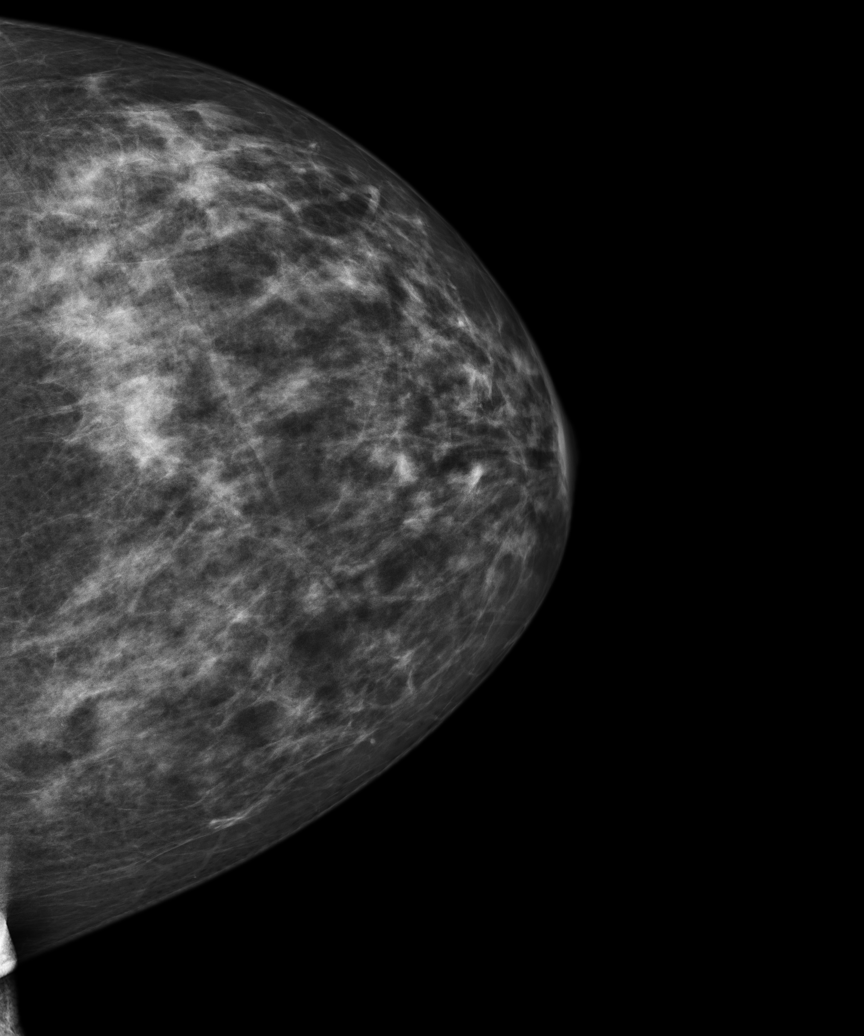

[5 of 5 positions shown; findings below may reference images not displayed]

ACR Breast Density Category c: The breasts are heterogeneously
dense, which may obscure small masses.
FINDINGS: In the right breast, calcifications warrant further evaluation with
magnified views. In the left breast, no mass or malignant type
calcifications are identified. Images were processed with CAD.
IMPRESSION: Further evaluation is suggested for calcifications in the right
breast.

RECOMMENDATION:
Diagnostic mammogram of the right breast. (Code:BH-H-11N)

The patient will be contacted regarding the findings, and additional
imaging will be scheduled.

BI-RADS CATEGORY  0: Incomplete. Need additional imaging evaluation
and/or prior mammograms for comparison.

## 2016-01-08 LAB — TSH: TSH: 1.79 u[IU]/mL (ref 0.41–5.90)

## 2016-01-08 LAB — LIPID PANEL
CHOLESTEROL: 231 mg/dL — AB (ref 0–200)
HDL: 45 mg/dL (ref 35–70)
LDL CALC: 162 mg/dL
TRIGLYCERIDES: 120 mg/dL (ref 40–160)

## 2016-01-08 LAB — CBC AND DIFFERENTIAL
HCT: 40 % (ref 36–46)
HEMOGLOBIN: 13.5 g/dL (ref 12.0–16.0)
Platelets: 396 10*3/uL (ref 150–399)
WBC: 6 10^3/mL

## 2016-01-08 LAB — BASIC METABOLIC PANEL
BUN: 13 mg/dL (ref 4–21)
Creatinine: 0.8 mg/dL (ref 0.5–1.1)
Glucose: 93 mg/dL
Potassium: 4.5 mmol/L (ref 3.4–5.3)
SODIUM: 145 mmol/L (ref 137–147)

## 2016-01-08 LAB — HEPATIC FUNCTION PANEL
ALT: 15 U/L (ref 7–35)
AST: 21 U/L (ref 13–35)
Alkaline Phosphatase: 55 U/L (ref 25–125)
BILIRUBIN, TOTAL: 0.4 mg/dL

## 2016-01-16 ENCOUNTER — Encounter: Payer: Self-pay | Admitting: Internal Medicine

## 2016-01-16 ENCOUNTER — Ambulatory Visit (INDEPENDENT_AMBULATORY_CARE_PROVIDER_SITE_OTHER): Payer: BLUE CROSS/BLUE SHIELD | Admitting: Internal Medicine

## 2016-01-16 ENCOUNTER — Other Ambulatory Visit (HOSPITAL_COMMUNITY)
Admission: RE | Admit: 2016-01-16 | Discharge: 2016-01-16 | Disposition: A | Discharge status: Home / Self Care | Payer: BLUE CROSS/BLUE SHIELD | Source: Ambulatory Visit | Attending: Internal Medicine | Admitting: Internal Medicine

## 2016-01-16 VITALS — BP 110/72 | HR 75 | Temp 97.8°F | Resp 12 | Ht 65.0 in | Wt 200.5 lb

## 2016-01-16 DIAGNOSIS — E669 Obesity, unspecified: Secondary | ICD-10-CM

## 2016-01-16 DIAGNOSIS — Z124 Encounter for screening for malignant neoplasm of cervix: Secondary | ICD-10-CM | POA: Diagnosis not present

## 2016-01-16 DIAGNOSIS — Z1151 Encounter for screening for human papillomavirus (HPV): Secondary | ICD-10-CM | POA: Insufficient documentation

## 2016-01-16 DIAGNOSIS — Z0000 Encounter for general adult medical examination without abnormal findings: Secondary | ICD-10-CM

## 2016-01-16 DIAGNOSIS — E785 Hyperlipidemia, unspecified: Secondary | ICD-10-CM

## 2016-01-16 DIAGNOSIS — Z01419 Encounter for gynecological examination (general) (routine) without abnormal findings: Secondary | ICD-10-CM | POA: Diagnosis present

## 2016-01-16 DIAGNOSIS — E049 Nontoxic goiter, unspecified: Secondary | ICD-10-CM

## 2016-01-16 DIAGNOSIS — D492 Neoplasm of unspecified behavior of bone, soft tissue, and skin: Secondary | ICD-10-CM

## 2016-01-16 DIAGNOSIS — E010 Iodine-deficiency related diffuse (endemic) goiter: Secondary | ICD-10-CM

## 2016-01-16 NOTE — Progress Notes (Signed)
Pre-visit discussion using our clinic review tool. No additional management support is needed unless otherwise documented below in the visit note.  

## 2016-01-16 NOTE — Progress Notes (Signed)
Patient ID: Amy Oliver, female    DOB: 05/05/1961  Age: 55 y.o. MRN: [id number]  The patient is here for annual CPE examination and management of other chronic and acute problems.  Requesting PAP smear since she is taking tamoxifen for atypical ductal hyperplasia  The risk factors are reflected in the social history.  The roster of all physicians providing medical care to patient - is listed in the Snapshot section of the chart.  Home safety : The patient has smoke detectors in the home. They wear seatbelts.  There are no firearms at home. There is no violence in the home.   There is no risks for hepatitis, STDs or HIV. There is no   history of blood transfusion. They have no travel history to infectious disease endemic areas of the world.  The patient has seen their dentist in the last six month. They have seen their eye doctor in the last year.    They do not  have excessive sun exposure. Discussed the need for sun protection: hats, long sleeves and use of sunscreen if there is significant sun exposure.   Depression screen: there are no signs or vegative symptoms of depression- irritability, change in appetite, anhedonia, sadness/tearfullness.  Cognitive assessment: the patient manages all their financial and personal affairs and is actively engaged. They could relate day,date,year and events; recalled 2/3 objects at 3 minutes; performed clock-face test normally.  The following portions of the patient's history were reviewed and updated as appropriate: allergies, current medications, past family history, past medical history,  past surgical history, past social history  and problem list.  Visual acuity was not assessed per patient preference since she has regular follow up with her ophthalmologist. Hearing and body mass index were assessed and reviewed.   During the course of the visit the patient was educated and counseled about appropriate screening and preventive services  including : fall prevention , diabetes screening, nutrition counseling, colorectal cancer screening, and recommended immunizations.    CC: There were no encounter diagnoses.    Exercising daily. Has lost 23 lbs  Occasional hip pain, thinks it may be arthritis aggravated by gluten sensitivity.  Sees a chiropractor,  Gets adjusted regularly      History Amy Oliver has a past medical history of History of nephrolithiasis.   She has past surgical history that includes Cholecystectomy (2007); Tonsillectomy; Cesarean section; and Breast surgery (Right, 08/09/2013).   Her family history includes Breast cancer in her maternal aunt and paternal grandmother; Diabetes in her brother; Hypertension in her father; Lung cancer in her paternal aunt; Pulmonary fibrosis in her father.She reports that she has never smoked. She has never used smokeless tobacco. She reports that she drinks alcohol. She reports that she does not use illicit drugs.  Outpatient Prescriptions Prior to Visit  Medication Sig Dispense Refill  . Cholecalciferol (BIO-D-MULSION PO) Take 5 drops by mouth daily.     . Difluprednate (DUREZOL) 0.05 % EMUL Apply 2 drops to eye 3 (three) times daily as needed.     Marland Kitchen VITAMIN D, ERGOCALCIFEROL, PO Take by mouth. Reported on 01/16/2016     No facility-administered medications prior to visit.    Review of Systems   Patient denies headache, fevers, malaise, unintentional weight loss, skin rash, eye pain, sinus congestion and sinus pain, sore throat, dysphagia,  hemoptysis , cough, dyspnea, wheezing, chest pain, palpitations, orthopnea, edema, abdominal pain, nausea, melena, diarrhea, constipation, flank pain, dysuria, hematuria, urinary  Frequency, nocturia, numbness, tingling, seizures,  Focal weakness, Loss of consciousness,  Tremor, insomnia, depression, anxiety, and suicidal ideation.     Objective:  BP 110/72 mmHg  Pulse 75  Temp(Src) 97.8 F (36.6 C) (Oral)  Resp 12  Ht 5\' 5"  (1.651 m)   Wt 200 lb 8 oz (90.946 kg)  BMI 33.36 kg/m2  SpO2 97%  LMP 09/18/2015  Physical Exam   General Appearance:    Alert, cooperative, no distress, appears stated age  Head:    Normocephalic, without obvious abnormality, atraumatic  Eyes:    PERRL, conjunctiva/corneas clear, EOM's intact, fundi    benign, both eyes  Ears:    Normal TM's and external ear canals, both ears  Nose:   Nares normal, septum midline, mucosa normal, no drainage    or sinus tenderness  Throat:   Lips, mucosa, and tongue normal; teeth and gums normal  Neck:   Supple, symmetrical, trachea midline, no adenopathy;    thyroid:  no enlargement/tenderness/nodules; no carotid   bruit or JVD  Back:     Symmetric, no curvature, ROM normal, no CVA tenderness  Lungs:     Clear to auscultation bilaterally, respirations unlabored  Chest Wall:    No tenderness or deformity   Heart:    Regular rate and rhythm, S1 and S2 normal, no murmur, rub   or gallop  Breast Exam:    Right nipple inverted (chronic), No tenderness, masses, or nipple abnormality  Abdomen:     Soft, non-tender, bowel sounds active all four quadrants,    no masses, no organomegaly  Genitalia:    Pelvic: cervix normal in appearance, external genitalia normal, no adnexal masses or tenderness, no cervical motion tenderness, rectovaginal septum normal, uterus normal size, shape, and consistency and vagina normal without discharge  Extremities:   Extremities normal, atraumatic, no cyanosis or edema  Pulses:   2+ and symmetric all extremities  Skin:   Skin color, texture, turgor normal, no rashes or lesions  Lymph nodes:   Cervical, supraclavicular, and axillary nodes normal  Neurologic:   CNII-XII intact, normal strength, sensation and reflexes    throughout      Assessment & Plan:   Problem List Items Addressed This Visit    None      I am having Ms. Rohm maintain her (VITAMIN D, ERGOCALCIFEROL, PO), Difluprednate, Cholecalciferol (BIO-D-MULSION PO),  aspirin, and tamoxifen.  Meds ordered this encounter  Medications  . aspirin 81 MG tablet    Sig: Take 81 mg by mouth daily.  . tamoxifen (NOLVADEX) 20 MG tablet    Sig: Take 20 mg by mouth daily.    There are no discontinued medications.  Follow-up: No Follow-up on file.   Crecencio Mc, MD

## 2016-01-16 NOTE — Patient Instructions (Signed)
Thyroid ultrasound will be set up  Referral to Dr Leanora Ivanoff (dermatolgy) to eval the "spot " on her neck    I recommend getting the majority of your calcium and Vitamin D  through diet rather than supplements given the recent association of calcium supplements with increased coronary artery calcium scores. (You need 1200 to 1800 mg daily)  Try the almond , and cashew milks that most grocery stores  now carry  in the dairy  Section>   They are lactose free:  [employer] brand Almond Light,  Original formula.  Delicious,  Low carb,  Low cal,  Cholesterol free  I     Based on your fasting cholesterol a,  your 10 year risk of having some type of coronary event (including heart attack) is 9% , meaning that one of  every 11 people with the same medical statistics will have a heart attack in the next 10 years.     The [employer] of [employer] recommends starting patients on moderate intensity statin therapy for people with this risk  to lower your risks of these events.  The natural remedies for cholesterol have not been proven to reduce your risk for a heart attack.  Red Yeast Rice has not been proven either,  But does lower cholesterol, so if you want to try it , the dose is 600 mg twice daily in capsule form, available OTC.   Menopause is a normal process in which your reproductive ability comes to an end. This process happens gradually over a span of months to years, usually between the ages of 61 and 75. Menopause is complete when you have missed 12 consecutive menstrual periods. It is important to talk with your health care provider about some of the most common conditions that affect postmenopausal women, such as heart disease, cancer, and bone loss (osteoporosis). Adopting a healthy lifestyle and getting preventive care can help to promote your health and wellness. Those actions can also lower your chances of developing some of these common conditions. WHAT SHOULD I KNOW ABOUT  MENOPAUSE? During menopause, you may experience a number of symptoms, such as:  Moderate-to-severe hot flashes.  Night sweats.  Decrease in sex drive.  Mood swings.  Headaches.  Tiredness.  Irritability.  Memory problems.  Insomnia. Choosing to treat or not to treat menopausal changes is an individual decision that you make with your health care provider. WHAT SHOULD I KNOW ABOUT HORMONE REPLACEMENT THERAPY AND SUPPLEMENTS? Hormone therapy products are effective for treating symptoms that are associated with menopause, such as hot flashes and night sweats. Hormone replacement carries certain risks, especially as you become older. If you are thinking about using estrogen or estrogen with progestin treatments, discuss the benefits and risks with your health care provider. WHAT SHOULD I KNOW ABOUT HEART DISEASE AND STROKE? Heart disease, heart attack, and stroke become more likely as you age. This may be due, in part, to the hormonal changes that your body experiences during menopause. These can affect how your body processes dietary fats, triglycerides, and cholesterol. Heart attack and stroke are both medical emergencies. There are many things that you can do to help prevent heart disease and stroke:  Have your blood pressure checked at least every 1-2 years. High blood pressure causes heart disease and increases the risk of stroke.  If you are 5-19 years old, ask your health care provider if you should take aspirin to prevent a heart attack or a stroke.  Do not use any  tobacco products, including cigarettes, chewing tobacco, or electronic cigarettes. If you need help quitting, ask your health care provider.  It is important to eat a healthy diet and maintain a healthy weight.  Be sure to include plenty of vegetables, fruits, low-fat dairy products, and lean protein.  Avoid eating foods that are high in solid fats, added sugars, or salt (sodium).  Get regular exercise. This is  one of the most important things that you can do for your health.  Try to exercise for at least 150 minutes each week. The type of exercise that you do should increase your heart rate and make you sweat. This is known as moderate-intensity exercise.  Try to do strengthening exercises at least twice each week. Do these in addition to the moderate-intensity exercise.  Know your numbers.Ask your health care provider to check your cholesterol and your blood glucose. Continue to have your blood tested as directed by your health care provider. WHAT SHOULD I KNOW ABOUT CANCER SCREENING? There are several types of cancer. Take the following steps to reduce your risk and to catch any cancer development as early as possible. Breast Cancer  Practice breast self-awareness.  This means understanding how your breasts normally appear and feel.  It also means doing regular breast self-exams. Let your health care provider know about any changes, no matter how small.  If you are 38 or older, have a clinician do a breast exam (clinical breast exam or CBE) every year. Depending on your age, family history, and medical history, it may be recommended that you also have a yearly breast X-ray (mammogram).  If you have a family history of breast cancer, talk with your health care provider about genetic screening.  If you are at high risk for breast cancer, talk with your health care provider about having an MRI and a mammogram every year.  Breast cancer (BRCA) gene test is recommended for women who have family members with BRCA-related cancers. Results of the assessment will determine the need for genetic counseling and BRCA1 and for BRCA2 testing. BRCA-related cancers include these types:  Breast. This occurs in males or females.  Ovarian.  Tubal. This may also be called fallopian tube cancer.  Cancer of the abdominal or pelvic lining (peritoneal cancer).  Prostate.  Pancreatic. Cervical, Uterine, and  Ovarian Cancer Your health care provider may recommend that you be screened regularly for cancer of the pelvic organs. These include your ovaries, uterus, and vagina. This screening involves a pelvic exam, which includes checking for microscopic changes to the surface of your cervix (Pap test).  For women ages 21-65, health care providers may recommend a pelvic exam and a Pap test every three years. For women ages 41-65, they may recommend the Pap test and pelvic exam, combined with testing for human papilloma virus (HPV), every five years. Some types of HPV increase your risk of cervical cancer. Testing for HPV may also be done on women of any age who have unclear Pap test results.  Other health care providers may not recommend any screening for nonpregnant women who are considered low risk for pelvic cancer and have no symptoms. Ask your health care provider if a screening pelvic exam is right for you.  If you have had past treatment for cervical cancer or a condition that could lead to cancer, you need Pap tests and screening for cancer for at least 20 years after your treatment. If Pap tests have been discontinued for you, your risk factors (such  as having a new sexual partner) need to be reassessed to determine if you should start having screenings again. Some women have medical problems that increase the chance of getting cervical cancer. In these cases, your health care provider may recommend that you have screening and Pap tests more often.  If you have a family history of uterine cancer or ovarian cancer, talk with your health care provider about genetic screening.  If you have vaginal bleeding after reaching menopause, tell your health care provider.  There are currently no reliable tests available to screen for ovarian cancer. Lung Cancer Lung cancer screening is recommended for adults 17-60 years old who are at high risk for lung cancer because of a history of smoking. A yearly low-dose CT  scan of the lungs is recommended if you:  Currently smoke.  Have a history of at least 30 pack-years of smoking and you currently smoke or have quit within the past 15 years. A pack-year is smoking an average of one pack of cigarettes per day for one year. Yearly screening should:  Continue until it has been 15 years since you quit.  Stop if you develop a health problem that would prevent you from having lung cancer treatment. Colorectal Cancer  This type of cancer can be detected and can often be prevented.  Routine colorectal cancer screening usually begins at age 27 and continues through age 45.  If you have risk factors for colon cancer, your health care provider may recommend that you be screened at an earlier age.  If you have a family history of colorectal cancer, talk with your health care provider about genetic screening.  Your health care provider may also recommend using home test kits to check for hidden blood in your stool.  A small camera at the end of a tube can be used to examine your colon directly (sigmoidoscopy or colonoscopy). This is done to check for the earliest forms of colorectal cancer.  Direct examination of the colon should be repeated every 5-10 years until age 46. However, if early forms of precancerous polyps or small growths are found or if you have a family history or genetic risk for colorectal cancer, you may need to be screened more often. Skin Cancer  Check your skin from head to toe regularly.  Monitor any moles. Be sure to tell your health care provider:  About any new moles or changes in moles, especially if there is a change in a mole's shape or color.  If you have a mole that is larger than the size of a pencil eraser.  If any of your family members has a history of skin cancer, especially at a young age, talk with your health care provider about genetic screening.  Always use sunscreen. Apply sunscreen liberally and repeatedly throughout  the day.  Whenever you are outside, protect yourself by wearing long sleeves, pants, a wide-brimmed hat, and sunglasses. WHAT SHOULD I KNOW ABOUT OSTEOPOROSIS? Osteoporosis is a condition in which bone destruction happens more quickly than new bone creation. After menopause, you may be at an increased risk for osteoporosis. To help prevent osteoporosis or the bone fractures that can happen because of osteoporosis, the following is recommended:  If you are 18-76 years old, get at least 1,000 mg of calcium and at least 600 mg of vitamin D per day.  If you are older than age 70 but younger than age 52, get at least 1,200 mg of calcium and at least 600 mg  of vitamin D per day.  If you are older than age 33, get at least 1,200 mg of calcium and at least 800 mg of vitamin D per day. Smoking and excessive alcohol intake increase the risk of osteoporosis. Eat foods that are rich in calcium and vitamin D, and do weight-bearing exercises several times each week as directed by your health care provider. WHAT SHOULD I KNOW ABOUT HOW MENOPAUSE AFFECTS [place]? Depression may occur at any age, but it is more common as you become older. Common symptoms of depression include:  Low or sad mood.  Changes in sleep patterns.  Changes in appetite or eating patterns.  Feeling an overall lack of motivation or enjoyment of activities that you previously enjoyed.  Frequent crying spells. Talk with your health care provider if you think that you are experiencing depression. WHAT SHOULD I KNOW ABOUT IMMUNIZATIONS? It is important that you get and maintain your immunizations. These include:  Tetanus, diphtheria, and pertussis (Tdap) booster vaccine.  Influenza every year before the flu season begins.  Pneumonia vaccine.  Shingles vaccine. Your health care provider may also recommend other immunizations.   This information is not intended to replace advice given to you by your health care provider.  Make sure you discuss any questions you have with your health care provider.   Document Released: 10/01/2005 Document Revised: 08/30/2014 Document Reviewed: 04/11/2014 Elsevier Interactive Patient Education [employer].

## 2016-01-18 DIAGNOSIS — E010 Iodine-deficiency related diffuse (endemic) goiter: Secondary | ICD-10-CM | POA: Insufficient documentation

## 2016-01-18 DIAGNOSIS — D492 Neoplasm of unspecified behavior of bone, soft tissue, and skin: Secondary | ICD-10-CM | POA: Insufficient documentation

## 2016-01-18 DIAGNOSIS — E785 Hyperlipidemia, unspecified: Secondary | ICD-10-CM | POA: Insufficient documentation

## 2016-01-18 NOTE — Assessment & Plan Note (Signed)
Screening skin exam was normal except for a suspicious mole on her neck that needs further evaluatoin.  Referral to Dr Kellie Moor

## 2016-01-18 NOTE — Assessment & Plan Note (Signed)
Based on current lipid profile, the risk of clinically significant CAD is 9% over the next 10 years, using the Framingham risk calculator. Patient was advised that the [employer] of Cardiology recommends starting patients aged 53 or higher on moderate intensity statin therapy for LDL between 70-189 and 10 yr risk of CAD > 7.5% ;  and high intensity therapy for anyone with LDL > 190. Patient is considering a trial of red yeast rice first.

## 2016-01-18 NOTE — Assessment & Plan Note (Signed)
Annual comprehensive preventive exam was done as well as an evaluation and management of chronic conditions .  During the course of the visit the patient was educated and counseled about appropriate screening and preventive services including :  diabetes screening, lipid analysis with projected  10 year  risk for CAD , nutrition counseling, breast, cervical and colorectal cancer screening, and recommended immunizations.  Printed recommendations for health maintenance screenings was given.  Lab Results  Component Value Date   CHOL 244* 05/02/2014   HDL 41 05/02/2014   LDLCALC 170 05/02/2014   LDLDIRECT 226.5 09/28/2013   TRIG 113 05/02/2014   CHOLHDL 7 09/28/2013

## 2016-01-18 NOTE — Assessment & Plan Note (Signed)
[place] of neck ordered.  Thyroid function is normal.

## 2016-01-18 NOTE — Assessment & Plan Note (Signed)
I have congratulated her in reduction of   BMI and encouraged  Continued weight loss with goal of 10% of body weigh over the next 6 months using a low glycemic index diet and regular exercise a minimum of 5 days per week.    

## 2016-01-21 LAB — CYTOLOGY - PAP

## 2016-01-27 ENCOUNTER — Ambulatory Visit
Admission: RE | Admit: 2016-01-27 | Discharge: 2016-01-27 | Disposition: A | Discharge status: Home / Self Care | Payer: BLUE CROSS/BLUE SHIELD | Source: Ambulatory Visit | Attending: Internal Medicine | Admitting: Internal Medicine

## 2016-01-27 DIAGNOSIS — E049 Nontoxic goiter, unspecified: Secondary | ICD-10-CM | POA: Diagnosis present

## 2016-01-27 DIAGNOSIS — E010 Iodine-deficiency related diffuse (endemic) goiter: Secondary | ICD-10-CM

## 2016-01-29 ENCOUNTER — Ambulatory Visit: Payer: BLUE CROSS/BLUE SHIELD

## 2016-05-31 ENCOUNTER — Encounter: Payer: Self-pay | Admitting: Internal Medicine

## 2016-05-31 ENCOUNTER — Ambulatory Visit (INDEPENDENT_AMBULATORY_CARE_PROVIDER_SITE_OTHER): Payer: BLUE CROSS/BLUE SHIELD | Admitting: Internal Medicine

## 2016-05-31 ENCOUNTER — Other Ambulatory Visit (HOSPITAL_COMMUNITY)
Admission: RE | Admit: 2016-05-31 | Discharge: 2016-05-31 | Disposition: A | Discharge status: Home / Self Care | Payer: BLUE CROSS/BLUE SHIELD | Source: Ambulatory Visit | Attending: Internal Medicine | Admitting: Internal Medicine

## 2016-05-31 VITALS — BP 130/74 | HR 89 | Temp 97.9°F | Resp 12 | Ht 65.0 in | Wt 207.0 lb

## 2016-05-31 DIAGNOSIS — Z1151 Encounter for screening for human papillomavirus (HPV): Secondary | ICD-10-CM | POA: Insufficient documentation

## 2016-05-31 DIAGNOSIS — N760 Acute vaginitis: Secondary | ICD-10-CM

## 2016-05-31 DIAGNOSIS — R8582 Anal low risk human papillomavirus (HPV) DNA test positive: Secondary | ICD-10-CM

## 2016-05-31 DIAGNOSIS — R87810 Cervical high risk human papillomavirus (HPV) DNA test positive: Secondary | ICD-10-CM

## 2016-05-31 DIAGNOSIS — Z113 Encounter for screening for infections with a predominantly sexual mode of transmission: Secondary | ICD-10-CM | POA: Insufficient documentation

## 2016-05-31 DIAGNOSIS — Z01419 Encounter for gynecological examination (general) (routine) without abnormal findings: Secondary | ICD-10-CM | POA: Diagnosis present

## 2016-05-31 NOTE — Progress Notes (Signed)
Subjective:  Patient ID: Amy Oliver, female    DOB: 1961-05-19  Age: 55 y.o. MRN: [id number]  CC: The primary encounter diagnosis was Vaginitis and vulvovaginitis. Diagnoses of Cervical high risk HPV (human papillomavirus) test positive and Anal low risk human papillomavirus (HPV) DNA test positive were also pertinent to this visit.  HPI Has been having lower pelvic pain for the last 5 days   Amy Oliver presents for repeat PAP smear.  Was asked to return  in 6 months,  Here in 4 for repeat  Exam.  For the last 5 days she has been having lower pelvic pain that is constant, not severe .  Present in all positions,  Not affected by voiding or stooling.  Repotts a white discharge.     Outpatient Medications Prior to Visit  Medication Sig Dispense Refill  . aspirin 81 MG tablet Take 81 mg by mouth daily.    . Cholecalciferol (BIO-D-MULSION PO) Take 5 drops by mouth daily.     . Difluprednate (DUREZOL) 0.05 % EMUL Apply 2 drops to eye 3 (three) times daily as needed.     . tamoxifen (NOLVADEX) 20 MG tablet Take 20 mg by mouth daily.    Marland Kitchen VITAMIN D, ERGOCALCIFEROL, PO Take by mouth. Reported on 01/16/2016     No facility-administered medications prior to visit.     Review of Systems;  Patient denies headache, fevers, malaise, unintentional weight loss, skin rash, eye pain, sinus congestion and sinus pain, sore throat, dysphagia,  hemoptysis , cough, dyspnea, wheezing, chest pain, palpitations, orthopnea, edema, abdominal pain, nausea, melena, diarrhea, constipation, flank pain, dysuria, hematuria, urinary  Frequency, nocturia, numbness, tingling, seizures,  Focal weakness, Loss of consciousness,  Tremor, insomnia, depression, anxiety, and suicidal ideation.      Objective:  BP 130/74   Pulse 89   Temp 97.9 F (36.6 C) (Oral)   Resp 12   Ht 5\' 5"  (1.651 m)   Wt 207 lb (93.9 kg)   SpO2 98%   BMI 34.45 kg/m   BP Readings from Last 3 Encounters:  05/31/16 130/74    01/16/16 110/72  09/11/13 124/76    Wt Readings from Last 3 Encounters:  05/31/16 207 lb (93.9 kg)  01/16/16 200 lb 8 oz (90.9 kg)  09/11/13 212 lb 8 oz (96.4 kg)   General Appearance:    Alert, cooperative, no distress, appears stated age  Head:    Normocephalic, without obvious abnormality, atraumatic  Eyes:    PERRL, conjunctiva/corneas clear, EOM's intact, fundi    benign, both eyes     Neck:   Supple, symmetrical, trachea midline, no adenopathy;    thyroid:  no enlargement/tenderness/nodules; no carotid   bruit or JVD  Back:     Symmetric, no curvature, ROM normal, no CVA tenderness  Lungs:     Clear to auscultation bilaterally, respirations unlabored  Chest Wall:    No tenderness or deformity   Heart:    Regular rate and rhythm, S1 and S2 normal, no murmur, rub   or gallop     Abdomen:     Soft, non-tender, bowel sounds active all four quadrants,    no masses, no organomegaly  Genitalia:    Pelvic: cervix retroverted,  external genitalia normal, no adnexal masses or tenderness, no cervical motion tenderness, rectovaginal septum normal, uterus normal size, shape, and consistency and vagina normal with a thick white non odorous discharge  Extremities:   Extremities normal, atraumatic, no cyanosis or edema  Pulses:   2+ and symmetric all extremities  Skin:   Skin color, texture, turgor normal, no rashes or lesions  Lymph nodes:   Cervical, supraclavicular, and axillary nodes normal  Neurologic:   CNII-XII intact, normal strength, sensation and reflexes    throughout     No results found for: HGBA1C  Lab Results  Component Value Date   CREATININE 0.8 01/08/2016   CREATININE 0.9 05/02/2014   CREATININE 0.8 09/28/2013    Lab Results  Component Value Date   WBC 6.0 01/08/2016   HGB 13.5 01/08/2016   HCT 40 01/08/2016   PLT 396 01/08/2016   GLUCOSE 89 09/28/2013   CHOL 231 (A) 01/08/2016   TRIG 120 01/08/2016   HDL 45 01/08/2016   LDLDIRECT 226.5 09/28/2013    LDLCALC 162 01/08/2016   ALT 15 01/08/2016   AST 21 01/08/2016   NA 145 01/08/2016   K 4.5 01/08/2016   CL 107 09/28/2013   CREATININE 0.8 01/08/2016   BUN 13 01/08/2016   CO2 26 09/28/2013   TSH 1.79 01/08/2016    US Soft Tissue Head/neck  Result Date: 01/27/2016 CLINICAL DATA:  Thyromegaly, right fullness x6 months EXAM: THYROID ULTRASOUND TECHNIQUE: Ultrasound examination of the thyroid gland and adjacent soft tissues was performed. COMPARISON:  None. FINDINGS: Right thyroid lobe Measurements: 4.4 x 1.5 x 1.4 cm.  No nodules visualized. Left thyroid lobe Measurements: 4.7 x 1.2 x 1.4 cm.  No nodules visualized. Isthmus Thickness: 0.4 cm.  No nodules visualized. Lymphadenopathy None visualized. IMPRESSION: Normal thyroid.  No focal lesion. Electronically Signed   By: Lucrezia Europe M.D.   On: 01/27/2016 16:36    Assessment & Plan:   Problem List Items Addressed This Visit    Cervical high risk HPV (human papillomavirus) test positive    With inconclusive PAP.  repeated today .  Vaginal culture and screen for stds done given lower pelvic pain .       Relevant Orders   Cytology - PAP    Other Visit Diagnoses    Vaginitis and vulvovaginitis    -  Primary   Relevant Orders   Cytology - PAP      I am having Ms. Raver maintain her (VITAMIN D, ERGOCALCIFEROL, PO), Difluprednate, Cholecalciferol (BIO-D-MULSION PO), aspirin, and tamoxifen.  No orders of the defined types were placed in this encounter.   There are no discontinued medications.  Follow-up: No Follow-up on file.   Crecencio Mc, MD

## 2016-05-31 NOTE — Progress Notes (Signed)
Pre-visit discussion using our clinic review tool. No additional management support is needed unless otherwise documented below in the visit note.  

## 2016-06-01 DIAGNOSIS — R87810 Cervical high risk human papillomavirus (HPV) DNA test positive: Secondary | ICD-10-CM | POA: Insufficient documentation

## 2016-06-01 NOTE — Assessment & Plan Note (Addendum)
With inconclusive PAP.  repeated today .  Vaginal culture and screen for stds done given lower pelvic pain .

## 2016-06-02 LAB — CYTOLOGY - PAP

## 2016-06-02 LAB — CERVICOVAGINAL ANCILLARY ONLY
BACTERIAL VAGINITIS: NEGATIVE
Candida vaginitis: NEGATIVE

## 2017-05-18 NOTE — Telephone Encounter (Signed)
Error

## 2017-05-20 ENCOUNTER — Telehealth: Payer: Self-pay | Admitting: Internal Medicine

## 2017-05-20 NOTE — Telephone Encounter (Signed)
Patient called having symptoms of UTI and says she is having pain in her uterus and some burning but no urinary symptoms. Patient states that she was examined by family member who is a Restaurant manager, fast food and said that her urethra was inflamed. Advised patient to go to urgent care to be evaluated.

## 2017-05-20 NOTE — Telephone Encounter (Signed)
Agree. With advice

## 2017-05-20 NOTE — Telephone Encounter (Signed)
Pt thinks she may have a UTI, and would like to come in to give a urine sample. Please call her at [phone].

## 2017-06-01 ENCOUNTER — Encounter: Payer: Self-pay | Admitting: Internal Medicine

## 2017-06-01 ENCOUNTER — Other Ambulatory Visit (HOSPITAL_COMMUNITY)
Admission: RE | Admit: 2017-06-01 | Discharge: 2017-06-01 | Disposition: A | Discharge status: Home / Self Care | Payer: 59 | Source: Ambulatory Visit | Attending: Internal Medicine | Admitting: Internal Medicine

## 2017-06-01 ENCOUNTER — Ambulatory Visit (INDEPENDENT_AMBULATORY_CARE_PROVIDER_SITE_OTHER): Payer: 59 | Admitting: Internal Medicine

## 2017-06-01 VITALS — BP 112/58 | HR 90 | Temp 98.4°F | Resp 15 | Ht 65.0 in | Wt 210.2 lb

## 2017-06-01 DIAGNOSIS — Z124 Encounter for screening for malignant neoplasm of cervix: Secondary | ICD-10-CM

## 2017-06-01 DIAGNOSIS — Z79899 Other long term (current) drug therapy: Secondary | ICD-10-CM | POA: Diagnosis not present

## 2017-06-01 DIAGNOSIS — R300 Dysuria: Secondary | ICD-10-CM

## 2017-06-01 DIAGNOSIS — N6099 Unspecified benign mammary dysplasia of unspecified breast: Secondary | ICD-10-CM

## 2017-06-01 DIAGNOSIS — N62 Hypertrophy of breast: Secondary | ICD-10-CM

## 2017-06-01 DIAGNOSIS — N760 Acute vaginitis: Secondary | ICD-10-CM

## 2017-06-01 DIAGNOSIS — N952 Postmenopausal atrophic vaginitis: Secondary | ICD-10-CM

## 2017-06-01 DIAGNOSIS — R87810 Cervical high risk human papillomavirus (HPV) DNA test positive: Secondary | ICD-10-CM

## 2017-06-01 DIAGNOSIS — R3915 Urgency of urination: Secondary | ICD-10-CM

## 2017-06-01 LAB — POCT URINALYSIS DIPSTICK
BILIRUBIN UA: NEGATIVE
GLUCOSE UA: NEGATIVE
Ketones, UA: NEGATIVE
NITRITE UA: NEGATIVE
PH UA: 6 (ref 5.0–8.0)
Protein, UA: NEGATIVE
Spec Grav, UA: 1.02 (ref 1.010–1.025)
UROBILINOGEN UA: 0.2 U/dL

## 2017-06-01 LAB — URINALYSIS, MICROSCOPIC ONLY

## 2017-06-01 MED ORDER — CLOBETASOL PROPIONATE 0.05 % EX OINT: 1.0000 "application " | TOPICAL_OINTMENT | Freq: Two times a day (BID) | CUTANEOUS | 0 refills | Status: DC

## 2017-06-01 MED ORDER — NITROFURANTOIN MONOHYD MACRO 100 MG PO CAPS: 100.0000 mg | ORAL_CAPSULE | Freq: Two times a day (BID) | ORAL | 0 refills | Status: DC

## 2017-06-01 NOTE — Progress Notes (Signed)
Subjective:  Patient ID: Amy Oliver, female    DOB: 1961/06/20  Age: 56 y.o. MRN: [id number]  CC: The primary encounter diagnosis was Urinary urgency. Diagnoses of Dysuria, Cervical cancer screening, Acute vaginitis, Atypical hyperplasia of breast, Long-term use of high-risk medication, Cervical high risk HPV (human papillomavirus) test positive, and Perimenopausal atrophic vaginitis were also pertinent to this visit.  HPI Amy Oliver presents for EVALUATION AND TREATMENT OF signs and symptoms which include vaginal irritation /paim without discharge, bladder pressure and urinary urgency .  She was evaluated last week at urgent care for symptoms and treated empirically with  macrobid until her Urine culture was negative , so she discontinued the antibiotic after 3 days of bid use.  She noted that the symptoms returned so she  has been taking macrobid once daily  Last dose was yesterday . Her brother in law , a Restaurant manager, fast food examined her and thought she had urethral irritation   She had a similar presentation one year ago.  She has noted that intercourse  painful due to dryness and recalls that current symptoms started after having relations with her husband two weeks ago.  She reports feeling mild pain on the right side of her vaginal wall but denies bleeding and discharge.  Her last period was 8 months ago.  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 aspirin 81 MG tablet Take 81 mg by mouth daily.    . Cholecalciferol (BIO-D-MULSION PO) Take 5 drops by mouth daily.     . Difluprednate (DUREZOL) 0.05 % EMUL Apply 2 drops to eye 3 (three) times daily as needed.     . tamoxifen (NOLVADEX) 20 MG tablet Take 20 mg  by mouth daily.    Marland Kitchen VITAMIN D, ERGOCALCIFEROL, PO Take by mouth. Reported on 01/16/2016     No facility-administered medications prior to visit.     Review of Systems;  Patient denies headache, fevers, malaise, unintentional weight loss, skin rash, eye pain, sinus congestion and sinus pain, sore throat, dysphagia,  hemoptysis , cough, dyspnea, wheezing, chest pain, palpitations, orthopnea, edema, abdominal pain, nausea, melena, diarrhea, constipation, flank pain, dysuria, hematuria, urinary  Frequency, nocturia, numbness, tingling, seizures,  Focal weakness, Loss of consciousness,  Tremor, insomnia, depression, anxiety, and suicidal ideation.      Objective:  BP (!) 112/58 (BP Location: Left Arm, Patient Position: Sitting, Cuff Size: Large)   Pulse 90   Temp 98.4 F (36.9 C) (Oral)   Resp 15   Ht 5\' 5"  (1.651 m)   Wt 210 lb 3.2 oz (95.3 kg)   SpO2 97%   BMI 34.98 kg/m   BP Readings from Last 3 Encounters:  06/01/17 (!) 112/58  05/31/16 130/74  01/16/16 110/72    Wt Readings from Last 3 Encounters:  06/01/17 210 lb 3.2 oz (95.3 kg)  05/31/16 207 lb (93.9 kg)  01/16/16 200 lb 8 oz (90.9 kg)    General appearance: alert, cooperative and appears stated age Back: symmetric, no curvature. ROM normal. No CVA tenderness. Lungs: clear to auscultation bilaterally Heart: regular rate and rhythm, S1, S2 normal, no murmur, click, rub or gallop Abdomen: soft, non-tender; bowel sounds normal; no masses,  no organomegaly Pulses:  2+ and symmetric GYN:  External vagina normal,  No ulcerations of vaginal wall, mucosa pale ,  No discharge , cervix nontender ,  Urethra non enflamed,  Skin: Skin color, texture, turgor normal. No rashes or lesions Lymph nodes: Cervical, supraclavicular, and axillary nodes normal.  No results found for: HGBA1C  Lab Results  Component Value Date   CREATININE 0.8 01/08/2016   CREATININE 0.9 05/02/2014   CREATININE 0.8 09/28/2013    Lab Results    Component Value Date   WBC 6.0 01/08/2016   HGB 13.5 01/08/2016   HCT 40 01/08/2016   PLT 396 01/08/2016   GLUCOSE 89 09/28/2013   CHOL 231 (A) 01/08/2016   TRIG 120 01/08/2016   HDL 45 01/08/2016   LDLDIRECT 226.5 09/28/2013   LDLCALC 162 01/08/2016   ALT 15 01/08/2016   AST 21 01/08/2016   NA 145 01/08/2016   K 4.5 01/08/2016   CL 107 09/28/2013   CREATININE 0.8 01/08/2016   BUN 13 01/08/2016   CO2 26 09/28/2013   TSH 1.79 01/08/2016    No results found.  Assessment & Plan:   Problem List Items Addressed This Visit    Atypical hyperplasia of breast    By surgical biopsy in 2015,  With strong FH of breast CA.  Lifetime risk of CA using GAIL model is 21% per surgical onc evaluation at [facility] and She has been taking and tolerating tamoxifen, since in August 2015 an receiving annual follow up at the high risk breast clinic at [place]: Cervical high risk HPV (human papillomavirus) test positive   Long-term use of high-risk medication    PAP smear was repeated today due to ongoing use of tamoxifen         Perimenopausal atrophic vaginitis    Symptoms are mild but persistent despite a negative outside urine culture .  Pelvic exam is normal today and PAP smear with screen for infection was done.  Topical estrogen would normally be prescribed but given her use of tamoxifen for ADH and FH of CA will avoid.   trial of clobetasol pending urine and vaginal culture results.       Other Visit Diagnoses    Urinary urgency    -  Primary   Relevant Orders   POCT Urinalysis Dipstick (Completed)   Urine Culture   Urine Microscopic Only (Completed)   Dysuria       Cervical cancer screening       Relevant Orders   Cytology - PAP   Acute vaginitis       Relevant Orders   Cytology - PAP      I am having Amy Oliver start on clobetasol ointment and nitrofurantoin (macrocrystal-monohydrate). I am also having her maintain her (VITAMIN D, ERGOCALCIFEROL, PO), Difluprednate,  Cholecalciferol (BIO-D-MULSION PO), aspirin, and tamoxifen.  Meds ordered this encounter  Medications  . clobetasol ointment (TEMOVATE) 0.05 %    Sig: Apply 1 application topically 2 (two) times daily.    Dispense:  30 g    Refill:  0  . nitrofurantoin, macrocrystal-monohydrate, (MACROBID) 100 MG capsule    Sig: Take 1 capsule (100 mg total) by mouth 2 (two) times daily.    Dispense:  14 capsule    Refill:  0    There are no discontinued medications.  Follow-up: No Follow-up on file.   Crecencio Mc, MD

## 2017-06-01 NOTE — Patient Instructions (Addendum)
I have prescribed a steroid ointment Clobetasol apply small amount to urethra twice daily  Keep the rx for macrobid and fill only if I tell you via mychart to start it.   If symptoms resolve,  No further workup,  If not,  Referral to [facility]   Atrophic Vaginitis Atrophic vaginitis is when the tissues that line the vagina become dry and thin. This is caused by a drop in estrogen. Estrogen helps:  To keep the vagina moist.  To make a clear fluid that helps: ? To lubricate the vagina for sex. ? To protect the vagina from infection.  If the lining of the vagina is dry and thin, it may:  Make sex painful. It may also cause bleeding.  Cause a feeling of: ? Burning. ? Irritation. ? Itchiness.  Make an exam of your vagina painful. It may also cause bleeding.  Make you lose interest in sex.  Cause a burning feeling when you pee.  Make your vaginal fluid (discharge) brown or yellow.  For some women, there are no symptoms. This condition is most common in women who do not get their regular menstrual periods anymore (menopause). This often starts when a woman is 79-76 years old. Follow these instructions at home:  Take medicines only as told by your doctor. Do not use any herbal or alternative medicines unless your doctor says it is okay.  Use over-the-counter products for dryness only as told by your doctor. These include: ? Creams. ? Lubricants. ? Moisturizers.  Do not douche.  Do not use products that can make your vagina dry. These include: ? Scented feminine sprays. ? Scented tampons. ? Scented soaps.  If it hurts to have sex, tell your sexual partner. Contact a doctor if:  Your discharge looks different than normal.  Your vagina has an unusual smell.  You have new symptoms.  Your symptoms do not get better with treatment.  Your symptoms get worse. This information is not intended to replace advice given to you by your health care provider. Make sure you  discuss any questions you have with your health care provider. Document Released: 01/26/2008 Document Revised: 01/15/2016 Document Reviewed: 07/31/2014 Elsevier Interactive Patient Education  [employer].

## 2017-06-02 ENCOUNTER — Telehealth: Payer: Self-pay | Admitting: Internal Medicine

## 2017-06-02 DIAGNOSIS — N952 Postmenopausal atrophic vaginitis: Secondary | ICD-10-CM | POA: Insufficient documentation

## 2017-06-02 DIAGNOSIS — Z79899 Other long term (current) drug therapy: Secondary | ICD-10-CM | POA: Insufficient documentation

## 2017-06-02 LAB — URINE CULTURE
MICRO NUMBER: 81129470
Result:: NO GROWTH
SPECIMEN QUALITY: ADEQUATE

## 2017-06-02 NOTE — Telephone Encounter (Signed)
Completed the missing information and gave the specimen back to the lab to send off.

## 2017-06-02 NOTE — Assessment & Plan Note (Addendum)
Symptoms are mild but persistent despite a negative outside urine culture .  Pelvic exam is normal today and PAP smear with screen for infection was done.  Topical estrogen would normally be prescribed but given her use of tamoxifen for ADH and FH of CA will avoid.   trial of clobetasol pending urine and vaginal culture results.

## 2017-06-02 NOTE — Assessment & Plan Note (Addendum)
By surgical biopsy in 2015,  With strong FH of breast CA.  Lifetime risk of CA using GAIL model is 21% per surgical onc evaluation at [facility] and She has been taking and tolerating tamoxifen, since in August 2015 an receiving annual follow up at the high risk breast clinic at [facility]

## 2017-06-02 NOTE — Assessment & Plan Note (Addendum)
PAP smear was repeated today due to ongoing use of tamoxifen

## 2017-06-02 NOTE — Telephone Encounter (Signed)
Amy Oliver from Cytology called and stated that they received a specimen with missing information and that it is on its way back to the office.

## 2017-06-03 LAB — CYTOLOGY - PAP
Bacterial vaginitis: NEGATIVE
Candida vaginitis: NEGATIVE
Chlamydia: NEGATIVE
DIAGNOSIS: NEGATIVE
HPV: NOT DETECTED
NEISSERIA GONORRHEA: NEGATIVE
Trichomonas: NEGATIVE

## 2017-06-06 ENCOUNTER — Telehealth: Payer: Self-pay | Admitting: Internal Medicine

## 2017-06-06 NOTE — Telephone Encounter (Signed)
Pt called back returning your call. Please advise, thank you!  Call pt @ [phone]

## 2017-06-07 LAB — CERVICOVAGINAL ANCILLARY ONLY: HERPES (WINDOWPATH): NEGATIVE

## 2017-06-07 NOTE — Telephone Encounter (Signed)
See result note message 

## 2017-10-20 ENCOUNTER — Ambulatory Visit (INDEPENDENT_AMBULATORY_CARE_PROVIDER_SITE_OTHER): Payer: 59 | Admitting: Family Medicine

## 2017-10-20 ENCOUNTER — Encounter: Payer: Self-pay | Admitting: Family Medicine

## 2017-10-20 VITALS — BP 140/82 | HR 93 | Temp 97.8°F | Resp 15 | Wt 219.4 lb

## 2017-10-20 DIAGNOSIS — R300 Dysuria: Secondary | ICD-10-CM | POA: Diagnosis not present

## 2017-10-20 LAB — POC URINALSYSI DIPSTICK (AUTOMATED)
Bilirubin, UA: NEGATIVE
Blood, UA: NEGATIVE
GLUCOSE UA: NEGATIVE
Ketones, UA: NEGATIVE
LEUKOCYTES UA: NEGATIVE
NITRITE UA: NEGATIVE
PROTEIN UA: NEGATIVE
Spec Grav, UA: >=1.03 — AB (ref 1.010–1.025)
UROBILINOGEN UA: 0.2 U/dL
pH, UA: 6 (ref 5.0–8.0)

## 2017-10-20 MED ORDER — NITROFURANTOIN MONOHYD MACRO 100 MG PO CAPS: 100.0000 mg | ORAL_CAPSULE | Freq: Two times a day (BID) | ORAL | 0 refills | Status: DC

## 2017-10-20 NOTE — Progress Notes (Signed)
Patient ID: Amy Oliver, female   DOB: 03-Dec-1960, 57 y.o.   MRN: [id number]   PCP: Crecencio Mc, MD  Subjective:  Amy Oliver is a 57 y.o. year old very pleasant female patient who presents with Urinary Tract symptoms: symptoms including dysuria and urgency. -started: 2 days ago   symptoms are not improving -previous treatments: She reports that her chiropractor gave her vitamin A drops and she has also been using several homeopathic medicines for urinary symptoms that have provided limited benefit.-   ROS-denies fever, chills, sweats, N/V, flank pain, vaginal discharge/irritation, or blood in urine  Pertinent Past Medical History- Atypical hyperplasia of breast followed at [facility], thyromegaly, perimenopausal atrophic vaginitis  Medications- reviewed  Current Outpatient Medications  Medication Sig Dispense Refill  . aspirin 81 MG tablet Take 81 mg by mouth daily.    . Cholecalciferol (BIO-D-MULSION PO) Take 5 drops by mouth daily.     . Difluprednate (DUREZOL) 0.05 % EMUL Apply 2 drops to eye 3 (three) times daily as needed.     . tamoxifen (NOLVADEX) 20 MG tablet Take 20 mg by mouth daily.    Marland Kitchen VITAMIN D, ERGOCALCIFEROL, PO Take by mouth. Reported on 01/16/2016     No current facility-administered medications for this visit.     Objective: BP 140/82 (BP Location: Left Arm, Patient Position: Sitting, Cuff Size: Large)   Pulse 93   Temp 97.8 F (36.6 C) (Oral)   Resp 15   Wt 219 lb 6 oz (99.5 kg)   SpO2 97%   BMI 36.51 kg/m  Gen: NAD, resting comfortably HEENT: oropharynx clear and moist CV: RRR no murmurs rubs or gallops Lungs: CTAB no crackles, wheeze, rhonchi Abdomen: soft/nontender/nondistended/normal bowel sounds. No rebound or guarding.  No CVA tenderness.   Suprapubic tenderness not present Ext: no edema Skin: warm, dry, no rash Neuro: grossly normal, moves all extremities  Assessment/Plan:  Dysuria UA indicates  + blood, negative for Leukocytes  and nitrites. History and exam today with symptoms may likely be early UTI.  Will send for culture. Empiric treatment with nitrofurantoin provided today. Cross checked for interaction with tamoxifen and none were found. We discussed that she could opt to hold off until culture returned or start the medication today as symptoms are present. She stated that her chiropractor thought this was likely bacterial in nature and that she should consider antibiotic therapy. She would like to initiate empiric treatment today.   Advised patient to complete antibiotic and she should follow up if her symptoms do not improve in 2 to 3 days, worsen, she develops a fever >101, or back pain. Also advised her to force fluids. Patient voiced understanding and agreed with plan.   Finally, we reviewed reasons to return to care including if symptoms worsen or persist or new concerns arise- once again particularly fever, N/V, or flank pain.   Laurita Quint, FNP

## 2017-10-20 NOTE — Progress Notes (Signed)
Patient ID: Amy Oliver, female   DOB: 06-08-1961, 57 y.o.   MRN: [id number]

## 2017-10-20 NOTE — Patient Instructions (Signed)
Please complete antibiotic as directed. If you develop symptoms of fever >101, pain in your back, or do not improve with treatment that has been provided in 3 to 4 days, please follow up for further evaluation and treatment. Dysuria Dysuria is pain or discomfort while urinating. The pain or discomfort may be felt in the tube that carries urine out of the bladder (urethra) or in the surrounding tissue of the genitals. The pain may also be felt in the groin area, lower abdomen, and lower back. You may have to urinate frequently or have the sudden feeling that you have to urinate (urgency). Dysuria can affect both men and women, but is more common in women. Dysuria can be caused by many different things, including:  Urinary tract infection in women.  Infection of the kidney or bladder.  Kidney stones or bladder stones.  Certain sexually transmitted infections (STIs), such as chlamydia.  Dehydration.  Inflammation of the vagina.  Use of certain medicines.  Use of certain soaps or scented products that cause irritation.  Follow these instructions at home: Watch your dysuria for any changes. The following actions may help to reduce any discomfort you are feeling:  Drink enough fluid to keep your urine clear or pale yellow.  Empty your bladder often. Avoid holding urine for long periods of time.  After a bowel movement or urination, women should cleanse from front to back, using each tissue only once.  Empty your bladder after sexual intercourse.  Take medicines only as directed by your health care provider.  If you were prescribed an antibiotic medicine, finish it all even if you start to feel better.  Avoid caffeine, tea, and alcohol. They can irritate the bladder and make dysuria worse. In men, alcohol may irritate the prostate.  Keep all follow-up visits as directed by your health care provider. This is important.  If you had any tests done to find the cause of dysuria, it is  your responsibility to obtain your test results. Ask the lab or department performing the test when and how you will get your results. Talk with your health care provider if you have any questions about your results.  Contact a health care provider if:  You develop pain in your back or sides.  You have a fever.  You have nausea or vomiting.  You have blood in your urine.  You are not urinating as often as you usually do. Get help right away if:  You pain is severe and not relieved with medicines.  You are unable to hold down any fluids.  You or someone else notices a change in your mental function.  You have a rapid heartbeat at rest.  You have shaking or chills.  You feel extremely weak. This information is not intended to replace advice given to you by your health care provider. Make sure you discuss any questions you have with your health care provider. Document Released: 05/07/2004 Document Revised: 01/15/2016 Document Reviewed: 04/04/2014 Elsevier Interactive Patient Education  [employer].

## 2017-10-21 LAB — URINALYSIS, MICROSCOPIC ONLY

## 2017-10-22 LAB — URINE CULTURE
MICRO NUMBER:: 90262603
SPECIMEN QUALITY:: ADEQUATE

## 2017-10-24 ENCOUNTER — Telehealth: Payer: Self-pay | Admitting: Internal Medicine

## 2017-10-24 NOTE — Telephone Encounter (Signed)
Copied from [place]. Topic: Quick Communication - Lab Results >> Oct 24, 2017  3:51 PM Bare, Patriciaann Clan, CMA wrote: Called patient to inform them of  lab results. When patient returns call, triage nurse  may} disclose results. >> Oct 24, 2017  4:34 PM Neva Seat wrote: Pt called back to receive lab results.  NT was busy.   Please call pt back at  [phone] asap.

## 2017-10-25 NOTE — Telephone Encounter (Signed)
Results given in result notes.

## 2018-03-24 ENCOUNTER — Encounter: Payer: Self-pay | Admitting: Internal Medicine

## 2018-03-24 ENCOUNTER — Ambulatory Visit (INDEPENDENT_AMBULATORY_CARE_PROVIDER_SITE_OTHER): Payer: 59 | Admitting: Internal Medicine

## 2018-03-24 VITALS — BP 126/82 | HR 76 | Temp 98.1°F | Resp 15 | Ht 65.0 in | Wt 210.8 lb

## 2018-03-24 DIAGNOSIS — R1900 Intra-abdominal and pelvic swelling, mass and lump, unspecified site: Secondary | ICD-10-CM | POA: Diagnosis not present

## 2018-03-24 DIAGNOSIS — N9089 Other specified noninflammatory disorders of vulva and perineum: Secondary | ICD-10-CM

## 2018-03-24 DIAGNOSIS — R1907 Generalized intra-abdominal and pelvic swelling, mass and lump: Secondary | ICD-10-CM

## 2018-03-24 HISTORY — DX: Other specified noninflammatory disorders of vulva and perineum: N90.89

## 2018-03-24 LAB — BASIC METABOLIC PANEL
BUN: 14 mg/dL (ref 6–23)
CHLORIDE: 105 meq/L (ref 96–112)
CO2: 26 meq/L (ref 19–32)
CREATININE: 0.73 mg/dL (ref 0.40–1.20)
Calcium: 9.5 mg/dL (ref 8.4–10.5)
GFR: 88.01 mL/min (ref >60.00)
Glucose, Bld: 92 mg/dL (ref 70–99)
POTASSIUM: 4.4 meq/L (ref 3.5–5.1)
Sodium: 138 mEq/L (ref 135–145)

## 2018-03-24 NOTE — Patient Instructions (Signed)
If needs excision consider Dr. Bary Castilla   Lipoma A lipoma is a noncancerous (benign) tumor that is made up of fat cells. This is a very common type of soft-tissue growth. Lipomas are usually found under the skin (subcutaneous). They may occur in any tissue of the body that contains fat. Common areas for lipomas to appear include the back, shoulders, buttocks, and thighs. Lipomas grow slowly, and they are usually painless. Most lipomas do not cause problems and do not require treatment. What are the causes? The cause of this condition is not known. What increases the risk? This condition is more likely to develop in:  People who are 66-53 years old.  People who have a family history of lipomas.  What are the signs or symptoms? A lipoma usually appears as a small, round bump under the skin. It may feel soft or rubbery, but the firmness can vary. Most lipomas are not painful. However, a lipoma may become painful if it is located in an area where it pushes on nerves. How is this diagnosed? A lipoma can usually be diagnosed with a physical exam. You may also have tests to confirm the diagnosis and to rule out other conditions. Tests may include:  Imaging tests, such as a CT scan or MRI.  Removal of a tissue sample to be looked at under a microscope (biopsy).  How is this treated? Treatment is not needed for small lipomas that are not causing problems. If a lipoma continues to get bigger or it causes problems, removal is often the best option. Lipomas can also be removed to improve appearance. Removal of a lipoma is usually done with a surgery in which the fatty cells and the surrounding capsule are removed. Most often, a medicine that numbs the area (local anesthetic) is used for this procedure. Follow these instructions at home:  Keep all follow-up visits as directed by your health care provider. This is important. Contact a health care provider if:  Your lipoma becomes larger or  hard.  Your lipoma becomes painful, red, or increasingly swollen. These could be signs of infection or a more serious condition. This information is not intended to replace advice given to you by your health care provider. Make sure you discuss any questions you have with your health care provider. Document Released: 07/30/2002 Document Revised: 01/15/2016 Document Reviewed: 08/05/2014 Elsevier Interactive Patient Education  [employer].

## 2018-03-24 NOTE — Progress Notes (Signed)
Chief Complaint  Patient presents with  . Acute Visit    lump in pubic area   F/u  C/o lump large in mons pubis labia area left notices after getting out of shower or sitting on toilet protrudes it moves, feels like the size of marble and only on left side no drainage or pain  Had recent reaction to bath and body works soap so she is not sure if this is cause and takes natural medications through chiropractor and just had labs from him    Review of Systems  Genitourinary:       Mass in private area     Past Medical History:  Diagnosis Date  . History of nephrolithiasis    Past Surgical History:  Procedure Laterality Date  . 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 Stereotactic biopsy clip not removed during the procedure. Patient declined further surgical intervention. [facility]  . CESAREAN SECTION     2  . CHOLECYSTECTOMY  2007  . TONSILLECTOMY     Family History  Problem Relation Age of Onset  . Hypertension Father   . Pulmonary fibrosis Father   . Diabetes Brother   . Lung cancer Paternal Aunt   . Breast cancer Paternal Grandmother   . Breast cancer Maternal Aunt    Social History   Socioeconomic History  . Marital status: Married    Spouse name: Not on file  . Number of children: Not on file  . Years of education: Not on file  . Highest education level: Not on file  Occupational History  . Occupation: Lexicographer: [employer]    Comment: non Facilities manager  Social Needs  . Financial resource strain: Not on file  . Food insecurity:    Worry: Not on file    Inability: Not on file  . Transportation needs:    Medical: Not on file    Non-medical: Not on file  Tobacco Use  . Smoking status: Never Smoker  . Smokeless tobacco: Never Used  Substance and Sexual Activity  . Alcohol use: Yes  . Drug use: No  . Sexual activity: Not on file    Lifestyle  . Physical activity:    Days per week: Not on file    Minutes per session: Not on file  . Stress: Not on file  Relationships  . Social connections:    Talks on phone: Not on file    Gets together: Not on file    Attends religious service: Not on file    Active member of club or organization: Not on file    Attends meetings of clubs or organizations: Not on file    Relationship status: Not on file  . Intimate partner violence:    Fear of current or ex partner: Not on file    Emotionally abused: Not on file    Physically abused: Not on file    Forced sexual activity: Not on file  Other Topics Concern  . Not on file  Social History Narrative  . Not on file   Current Meds  Medication Sig  . aspirin 81 MG tablet Take 81 mg by mouth daily.  . Cholecalciferol (BIO-D-MULSION PO) Take 5 drops by mouth daily.   . tamoxifen (NOLVADEX) 20 MG tablet Take 20 mg by mouth daily.   Allergies  Allergen Reactions  . Penicillins Rash   No results found for this  or any previous visit (from the past 2160 hour(s)). Objective  Body mass index is 35.08 kg/m. Wt Readings from Last 3 Encounters:  03/24/18 210 lb 12.8 oz (95.6 kg)  10/20/17 219 lb 6 oz (99.5 kg)  06/01/17 210 lb 3.2 oz (95.3 kg)   Temp Readings from Last 3 Encounters:  03/24/18 98.1 F (36.7 C) (Oral)  10/20/17 97.8 F (36.6 C) (Oral)  06/01/17 98.4 F (36.9 C) (Oral)   BP Readings from Last 3 Encounters:  03/24/18 126/82  10/20/17 140/82  06/01/17 (!) 112/58   Pulse Readings from Last 3 Encounters:  03/24/18 76  10/20/17 93  06/01/17 90    Physical Exam  Constitutional: She is oriented to person, place, and time. Vital signs are normal. She appears well-developed and well-nourished. She is cooperative.  HENT:  Head: Normocephalic and atraumatic.  Mouth/Throat: Oropharynx is clear and moist and mucous membranes are normal.  Eyes: Pupils are equal, round, and reactive to light. Conjunctivae are  normal.  Cardiovascular: Normal rate, regular rhythm and normal heart sounds.  Pulmonary/Chest: Effort normal and breath sounds normal.  Genitourinary:    Pelvic exam was performed with patient supine. There is lesion on the left labia.  Neurological: She is alert and oriented to person, place, and time. Gait normal.  Skin: Skin is warm, dry and intact.  Psychiatric: She has a normal mood and affect. Her speech is normal and behavior is normal. Judgment and thought content normal. Cognition and memory are normal.  Nursing note and vitals reviewed.   Assessment   1. Left mons/left labia 7 cm mobile lesion ? Lipoma vs cyst  Plan   1. Spoke to radiology Dr. Ellin Goodie he rec MRI pelvis w and w/o contrast  Check BMET today  Consider Dr. Bary Castilla in future  Pt to fax outside labs to pcp  F/u with PCP after study  Provider: Dr. Olivia Mackie McLean-Scocuzza-Internal Medicine

## 2018-03-27 ENCOUNTER — Encounter: Payer: Self-pay | Admitting: *Deleted

## 2018-04-16 ENCOUNTER — Ambulatory Visit
Admission: RE | Admit: 2018-04-16 | Discharge: 2018-04-16 | Disposition: A | Discharge status: Home / Self Care | Payer: 59 | Source: Ambulatory Visit | Attending: Internal Medicine | Admitting: Internal Medicine

## 2018-04-16 DIAGNOSIS — R1900 Intra-abdominal and pelvic swelling, mass and lump, unspecified site: Secondary | ICD-10-CM | POA: Insufficient documentation

## 2018-04-16 DIAGNOSIS — R197 Diarrhea, unspecified: Secondary | ICD-10-CM | POA: Diagnosis not present

## 2018-04-16 DIAGNOSIS — R1907 Generalized intra-abdominal and pelvic swelling, mass and lump: Secondary | ICD-10-CM

## 2018-04-16 MED ORDER — GADOBENATE DIMEGLUMINE 529 MG/ML IV SOLN
20.0000 mL | Freq: Once | INTRAVENOUS | Status: AC | PRN
  Administered 2018-04-16: 20 mL via INTRAVENOUS

## 2018-04-17 ENCOUNTER — Encounter: Payer: Self-pay | Admitting: Internal Medicine

## 2018-04-17 ENCOUNTER — Ambulatory Visit (INDEPENDENT_AMBULATORY_CARE_PROVIDER_SITE_OTHER): Payer: 59 | Admitting: Internal Medicine

## 2018-04-17 VITALS — BP 124/82 | HR 90 | Temp 97.9°F | Resp 15 | Ht 65.0 in | Wt 209.6 lb

## 2018-04-17 DIAGNOSIS — N62 Hypertrophy of breast: Secondary | ICD-10-CM | POA: Diagnosis not present

## 2018-04-17 DIAGNOSIS — N909 Noninflammatory disorder of vulva and perineum, unspecified: Secondary | ICD-10-CM

## 2018-04-17 DIAGNOSIS — Z79899 Other long term (current) drug therapy: Secondary | ICD-10-CM | POA: Diagnosis not present

## 2018-04-17 DIAGNOSIS — N6099 Unspecified benign mammary dysplasia of unspecified breast: Secondary | ICD-10-CM

## 2018-04-17 DIAGNOSIS — N9089 Other specified noninflammatory disorders of vulva and perineum: Secondary | ICD-10-CM

## 2018-04-17 DIAGNOSIS — R600 Localized edema: Secondary | ICD-10-CM

## 2018-04-17 NOTE — Progress Notes (Signed)
Subjective:  Patient ID: Amy Oliver, female    DOB: 02-14-1961  Age: 57 y.o. MRN: [id number]  CC: The primary encounter diagnosis was Vulvar mass. Diagnoses of Mass of perineum, female, Atypical hyperplasia of breast, Long-term use of high-risk medication, and Edema of both lower extremities were also pertinent to this visit.  HPI Amy Oliver presents for follow up on recently discovered subcutaneous mass in her mons pubis.  Noticed by patient one month ago.  Non tender.  Not sure how long it has been there or  if it has changed in size.  Had evaluation by Dr Aundra Dubin, and the mass was felt to be  a cyst  vs Lipoma   MRI  With and without contrast was ordered, and she is here today to discuss the results.   A 4 x 5 x 10 cm non enhancing solid mass in the left perineum/labia was reported,,  With Lipoma vs liposarcoma as the potential etiologies.  reported by radiology.  Surgical excision recommended for definitive diagnosis.    Referral advised  Given her history regular follow up at [facility] fdor atypical ductal hyperplasia and strong FH of  breast cancer,  She prefers to see a [employer] and the  [employer] is preferred for convenience.   Leg swelling: patient just returned 2 days ago from a 10 day trip to [place].  She was very active during the trip with walking and got up repeatedly during the flight to stretch legs,  Denies calf pain redness and tightness.  Outpatient Medications Prior to Visit  Medication Sig Dispense Refill  . aspirin 81 MG tablet Take 81 mg by mouth daily.    . Cholecalciferol (BIO-D-MULSION PO) Take 5 drops by mouth daily.     . Difluprednate (DUREZOL) 0.05 % EMUL Apply 2 drops to eye 3 (three) times daily as needed.     . tamoxifen (NOLVADEX) 20 MG tablet Take 20 mg by mouth daily.     No facility-administered medications prior to visit.     Review of Systems;  Patient denies headache, fevers, malaise, unintentional weight loss, skin  rash, eye pain, sinus congestion and sinus pain, sore throat, dysphagia,  hemoptysis , cough, dyspnea, wheezing, chest pain, palpitations, orthopnea, edema, abdominal pain, nausea, melena, diarrhea, constipation, flank pain, dysuria, hematuria, urinary  Frequency, nocturia, numbness, tingling, seizures,  Focal weakness, Loss of consciousness,  Tremor, insomnia, depression, anxiety, and suicidal ideation.      Objective:  BP 124/82 (BP Location: Left Arm, Patient Position: Sitting, Cuff Size: Normal)   Pulse 90   Temp 97.9 F (36.6 C) (Oral)   Resp 15   Ht 5\' 5"  (1.651 m)   Wt 209 lb 9.6 oz (95.1 kg)   LMP 09/18/2015   SpO2 96%   BMI 34.88 kg/m   BP Readings from Last 3 Encounters:  04/17/18 124/82  03/24/18 126/82  10/20/17 140/82    Wt Readings from Last 3 Encounters:  04/17/18 209 lb 9.6 oz (95.1 kg)  03/24/18 210 lb 12.8 oz (95.6 kg)  10/20/17 219 lb 6 oz (99.5 kg)    General appearance: alert, cooperative and appears stated age Ears: normal TM's and external ear canals both ears Throat: lips, mucosa, and tongue normal; teeth and gums normal Neck: no adenopathy, no carotid bruit, supple, symmetrical, trachea midline and thyroid not enlarged, symmetric, no tenderness/mass/nodules Back: symmetric, no curvature. ROM normal. No CVA tenderness. Lungs: clear to auscultation bilaterally Heart: regular rate and rhythm,  S1, S2 normal, no murmur, click, rub or gallop Abdomen: soft, non-tender; bowel sounds normal; no masses,  no organomegaly Pulses: 2+ and symmetric GYN: relative  predominance of mons pubis on left  With mobile mass apppreciated subcutaneously  Skin: Skin color, texture, turgor normal. No rashes or lesions. Minimal bilateral non pitting edema  Lymph nodes: Cervical, supraclavicular, and axillary nodes normal.  No results found for: HGBA1C  Lab Results  Component Value Date   CREATININE 0.73 03/24/2018   CREATININE 0.8 01/08/2016   CREATININE 0.9 05/02/2014     Lab Results  Component Value Date   WBC 6.0 01/08/2016   HGB 13.5 01/08/2016   HCT 40 01/08/2016   PLT 396 01/08/2016   GLUCOSE 92 03/24/2018   CHOL 231 (A) 01/08/2016   TRIG 120 01/08/2016   HDL 45 01/08/2016   LDLDIRECT 226.5 09/28/2013   LDLCALC 162 01/08/2016   ALT 15 01/08/2016   AST 21 01/08/2016   NA 138 03/24/2018   K 4.4 03/24/2018   CL 105 03/24/2018   CREATININE 0.73 03/24/2018   BUN 14 03/24/2018   CO2 26 03/24/2018   TSH 1.79 01/08/2016    Mr Pelvis W Wo Contrast  Result Date: 04/16/2018 CLINICAL DATA:  Left labial mass EXAM: MRI PELVIS WITHOUT AND WITH CONTRAST TECHNIQUE: Multiplanar multisequence MR imaging of the pelvis was performed both before and after administration of intravenous contrast. CONTRAST:  21mL MULTIHANCE GADOBENATE DIMEGLUMINE 529 MG/ML IV SOLN COMPARISON:  None. FINDINGS: Urinary Tract:  Bladder is within normal limits. Bowel:  Visualized bowel is unremarkable. Vascular/Lymphatic: No suspicious pelvic lymphadenopathy. Reproductive:  Dominant 5.0 cm subserosal left fundal fibroid. No adnexal masses. Other:  No pelvic ascites. Musculoskeletal: 3.9 x 5.3 x 10.0 cm fat density predominant lesion in the left perineum/labia (series 2/image 22). Minimal internal complexity (series 8/image 52). No enhancement following contrast administration. No convincing association with the left inguinal canal to suggest a hernia. As such, this appearance is compatible with a subcutaneous lipoma, noting that well-differentiated liposarcoma cannot be differentiated by imaging. No focal osseous lesions. IMPRESSION: Palpable abnormality corresponds to a 3.9 x 5.3 x 10.0 cm fat density lesion in the left perineum/labia, compatible with a subcutaneous lipoma. Notably, a well-differentiated liposarcoma cannot be differentiated by imaging. Consider surgical excision as clinically warranted. Electronically Signed   By: Julian Hy M.D.   On: 04/16/2018 13:58    Assessment  & Plan:   Problem List Items Addressed This Visit    Atypical hyperplasia of breast    By surgical biopsy in 2015,  With strong FH of breast CA.  Lifetime risk of CA using GAIL model is 21% per surgical onc evaluation at [facility] and She has been taking and tolerating tamoxifen, since August 2015 an receiving annual follow up at the high risk breast clinic at [facility]       Edema of both lower extremities    Very Mild, in the setting of recent trans [place] travel .   There is no asymmetry ,  Calf pain or erythema to suspect DVT, which was considered given her travel and use of tamoxifen       Long-term use of high-risk medication    She will return for annual PAP smear this fall,  Unless [facility] does one during their evaluation of her labila mass.       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  Referring to [facility] in [place] for surgical excision and biopsy        Other Visit Diagnoses    Vulvar mass    -  Primary   Relevant Orders   Ambulatory referral to Gynecology    A total of 25 minutes was spent with patient more than half of which was spent in counseling patient on the above mentioned issues , reviewing and explaining recent labs and imaging studies done, and coordination of care.   I am having Markeeta Scalf. Siverson maintain her Difluprednate, Cholecalciferol (BIO-D-MULSION PO), aspirin, and tamoxifen.  No orders of the defined types were placed in this encounter.   There are no discontinued medications.  Follow-up: No follow-ups on file.   Crecencio Mc, MD

## 2018-04-17 NOTE — Patient Instructions (Signed)
Your MRI suggests that the 4 x 5 x 10 cm fatty mass is a LIPOMA,  But liposarcoma CANNOT be ruled out without a biopsy  We have agreed that getting it out is the plan,  And I have made the referral to [facility] in [place]  The office will call you with the appomtment   You are due for your next PAP smear this Fall.  IF [facility] decides to do this for you, just let me know,  Otherwise I woul dplan to return in 3 months

## 2018-04-18 DIAGNOSIS — R600 Localized edema: Secondary | ICD-10-CM | POA: Insufficient documentation

## 2018-04-18 NOTE — Assessment & Plan Note (Signed)
By surgical biopsy in 2015,  With strong FH of breast CA.  Lifetime risk of CA using GAIL model is 21% per surgical onc evaluation at [facility] and She has been taking and tolerating tamoxifen, since August 2015 an receiving annual follow up at the high risk breast clinic at [facility]

## 2018-04-18 NOTE — Assessment & Plan Note (Signed)
She will return for annual PAP smear this fall,  Unless [facility] does one during their evaluation of her labila mass.

## 2018-04-18 NOTE — Assessment & Plan Note (Signed)
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  Referring to [facility] in [place] for surgical excision and biopsy

## 2018-04-18 NOTE — Assessment & Plan Note (Signed)
Very Mild, in the setting of recent trans [place] travel .   There is no asymmetry ,  Calf pain or erythema to suspect DVT, which was considered given her travel and use of tamoxifen

## 2018-04-20 ENCOUNTER — Telehealth: Payer: Self-pay

## 2018-04-20 NOTE — Telephone Encounter (Signed)
Copied from [place] [phone]. Topic: Referral - Request >> Apr 20, 2018  1:10 PM Bea Graff, NT wrote: Reason for CRM: Patient would like to change her [facility]YN referral to [place], Dr. Tammy Sours. Fax#: [phone] Phone#: [phone]

## 2018-06-02 ENCOUNTER — Encounter: Payer: Self-pay | Admitting: Internal Medicine

## 2018-06-02 ENCOUNTER — Other Ambulatory Visit (HOSPITAL_COMMUNITY)
Admission: RE | Admit: 2018-06-02 | Discharge: 2018-06-02 | Disposition: A | Discharge status: Home / Self Care | Payer: 59 | Source: Ambulatory Visit | Attending: Internal Medicine | Admitting: Internal Medicine

## 2018-06-02 ENCOUNTER — Ambulatory Visit (INDEPENDENT_AMBULATORY_CARE_PROVIDER_SITE_OTHER): Payer: 59 | Admitting: Internal Medicine

## 2018-06-02 VITALS — BP 122/82 | HR 86 | Temp 98.0°F | Ht 65.0 in | Wt 209.8 lb

## 2018-06-02 DIAGNOSIS — E782 Mixed hyperlipidemia: Secondary | ICD-10-CM | POA: Diagnosis not present

## 2018-06-02 DIAGNOSIS — Z0000 Encounter for general adult medical examination without abnormal findings: Secondary | ICD-10-CM | POA: Diagnosis not present

## 2018-06-02 DIAGNOSIS — N9089 Other specified noninflammatory disorders of vulva and perineum: Secondary | ICD-10-CM

## 2018-06-02 DIAGNOSIS — E669 Obesity, unspecified: Secondary | ICD-10-CM

## 2018-06-02 DIAGNOSIS — N6099 Unspecified benign mammary dysplasia of unspecified breast: Secondary | ICD-10-CM

## 2018-06-02 DIAGNOSIS — Z1211 Encounter for screening for malignant neoplasm of colon: Secondary | ICD-10-CM | POA: Diagnosis not present

## 2018-06-02 DIAGNOSIS — Z124 Encounter for screening for malignant neoplasm of cervix: Secondary | ICD-10-CM

## 2018-06-02 DIAGNOSIS — Z79899 Other long term (current) drug therapy: Secondary | ICD-10-CM

## 2018-06-02 LAB — COMPREHENSIVE METABOLIC PANEL
ALK PHOS: 50 U/L (ref 39–117)
ALT: 13 U/L (ref 0–35)
AST: 17 U/L (ref 0–37)
Albumin: 4.2 g/dL (ref 3.5–5.2)
BUN: 14 mg/dL (ref 6–23)
CO2: 28 mEq/L (ref 19–32)
Calcium: 9.4 mg/dL (ref 8.4–10.5)
Chloride: 107 mEq/L (ref 96–112)
Creatinine, Ser: 0.74 mg/dL (ref 0.40–1.20)
GFR: 86.58 mL/min (ref >60.00)
GLUCOSE: 93 mg/dL (ref 70–99)
POTASSIUM: 4.6 meq/L (ref 3.5–5.1)
SODIUM: 141 meq/L (ref 135–145)
TOTAL PROTEIN: 7.1 g/dL (ref 6.0–8.3)
Total Bilirubin: 0.5 mg/dL (ref 0.2–1.2)

## 2018-06-02 LAB — LIPID PANEL
Cholesterol: 236 mg/dL — ABNORMAL HIGH (ref 0–200)
HDL: 49.9 mg/dL (ref >39.00)
LDL Cholesterol: 164 mg/dL — ABNORMAL HIGH (ref 0–99)
NONHDL: 185.7
Total CHOL/HDL Ratio: 5
Triglycerides: 111 mg/dL (ref 0.0–149.0)
VLDL: 22.2 mg/dL (ref 0.0–40.0)

## 2018-06-02 LAB — TSH: TSH: 1.76 u[IU]/mL (ref 0.35–4.50)

## 2018-06-02 NOTE — Progress Notes (Signed)
Patient ID: Amy Oliver, female    DOB: 1961-07-21  Age: 57 y.o. MRN: [id number]  The patient is here for annualpreventive examination and management of other chronic and acute problems.   The risk factors are reflected in the social history.  The roster of all physicians providing medical care to patient - is listed in the Snapshot section of the chart.  Activities of daily living:  The patient is 100% independent in all ADLs: dressing, toileting, feeding as well as independent mobility  Home safety : The patient has smoke detectors in the home. They wear seatbelts.  There are no firearms at home. There is no violence in the home.   There is no risks for hepatitis, STDs or HIV. There is no   history of blood transfusion. They have no travel history to infectious disease endemic areas of the world.  The patient has seen their dentist in the last six month. They have seen their eye doctor in the last year. They deny  hearing difficulty with regard to whispered voices and some television programs.  They do not  have excessive sun exposure. Discussed the need for sun protection: hats, long sleeves and use of sunscreen if there is significant sun exposure.   Diet: the importance of a healthy diet is discussed. They do have a healthy diet.  The benefits of regular aerobic exercise were discussed. She walks 4 times per week ,  20 minutes.   Depression screen: there are no signs or vegative symptoms of depression- irritability, change in appetite, anhedonia, sadness/tearfullness.   The following portions of the patient's history were reviewed and updated as appropriate: allergies, current medications, past family history, past medical history,  past surgical history, past social history  and problem list.  Visual acuity was not assessed per patient preference since she has regular follow up with her ophthalmologist. Hearing and body mass index were assessed and reviewed.   During the course  of the visit the patient was educated and counseled about appropriate screening and preventive services including : fall prevention , diabetes screening, nutrition counseling, colorectal cancer screening, and recommended immunizations.    CC: The primary encounter diagnosis was Cervical cancer screening. Diagnoses of Colon cancer screening, Mixed hyperlipidemia, Obesity (BMI 30-39.9), Mass of perineum, female, Encounter for preventive health examination, Long-term use of high-risk medication, and Atypical hyperplasia of breast were also pertinent to this visit.  History Amy Oliver has a past medical history of History of nephrolithiasis.   She has a past surgical history that includes Cholecystectomy (2007); Tonsillectomy; Cesarean section; and Breast surgery (Right, 08/09/2013).   Her family history includes Breast cancer in her maternal aunt and paternal grandmother; Diabetes in her brother; Hypertension in her father; Lung cancer in her paternal aunt; Pulmonary fibrosis in her father.She reports that she has never smoked. She has never used smokeless tobacco. She reports that she drinks alcohol. She reports that she does not use drugs.  Outpatient Medications Prior to Visit  Medication Sig Dispense Refill  . aspirin 81 MG tablet Take 81 mg by mouth daily.    . Cholecalciferol (BIO-D-MULSION PO) Take 5 drops by mouth daily.     . tamoxifen (NOLVADEX) 20 MG tablet Take 20 mg by mouth daily.    . Difluprednate (DUREZOL) 0.05 % EMUL Apply 2 drops to eye 3 (three) times daily as needed.      No facility-administered medications prior to visit.     Review of Systems   Patient denies headache,  fevers, malaise, unintentional weight loss, skin rash, eye pain, sinus congestion and sinus pain, sore throat, dysphagia,  hemoptysis , cough, dyspnea, wheezing, chest pain, palpitations, orthopnea, edema, abdominal pain, nausea, melena, diarrhea, constipation, flank pain, dysuria, hematuria, urinary   Frequency, nocturia, numbness, tingling, seizures,  Focal weakness, Loss of consciousness,  Tremor, insomnia, depression, anxiety, and suicidal ideation.      Objective:  BP 122/82 (BP Location: Left Arm, Patient Position: Sitting, Cuff Size: Normal)   Pulse 86   Temp 98 F (36.7 C) (Oral)   Ht 5\' 5"  (1.651 m)   Wt 209 lb 12.8 oz (95.2 kg)   LMP 09/18/2015   BMI 34.91 kg/m   Physical Exam   General Appearance:    Alert, cooperative, no distress, appears stated age  Head:    Normocephalic, without obvious abnormality, atraumatic  Eyes:    PERRL, conjunctiva/corneas clear, EOM's intact, fundi    benign, both eyes  Ears:    Normal TM's and external ear canals, both ears  Nose:   Nares normal, septum midline, mucosa normal, no drainage    or sinus tenderness  Throat:   Lips, mucosa, and tongue normal; teeth and gums normal  Neck:   Supple, symmetrical, trachea midline, no adenopathy;    thyroid:  no enlargement/tenderness/nodules; no carotid   bruit or JVD  Back:     Symmetric, no curvature, ROM normal, no CVA tenderness  Lungs:     Clear to auscultation bilaterally, respirations unlabored  Chest Wall:    No tenderness or deformity   Heart:    Regular rate and rhythm, S1 and S2 normal, no murmur, rub   or gallop  Breast Exam:    No tenderness, masses, or nipple abnormality  Abdomen:     Soft, non-tender, bowel sounds active all four quadrants,    no masses, no organomegaly  Genitalia:    Pelvic: cervix normal in appearance, external genitalia normal, no adnexal masses or tenderness, no cervical motion tenderness, rectovaginal septum normal, uterus normal size, shape, and consistency and vagina normal without discharge  Extremities:   Extremities normal, atraumatic, no cyanosis or edema  Pulses:   2+ and symmetric all extremities  Skin:   Skin color, texture, turgor normal, no rashes or lesions  Lymph nodes:   Cervical, supraclavicular, and axillary nodes normal  Neurologic:    CNII-XII intact, normal strength, sensation and reflexes    throughout     Assessment & Plan:   Problem List Items Addressed This Visit    Atypical hyperplasia of breast    By surgical biopsy in 2015,  With strong FH of breast CA.  Lifetime risk of CA using GAIL model is 21% per surgical onc evaluation at [facility] and She has been taking and tolerating tamoxifen since August 2015 an receiving annual follow up at the high risk breast clinic at [facility] for preventive health examination    Annual comprehensive preventive exam was done as well as an evaluation and management of chronic conditions .  During the course of the visit the patient was educated and counseled about appropriate screening and preventive services including :  diabetes screening, lipid analysis with projected  10 year  risk for CAD , nutrition counseling, breast, cervical and colorectal cancer screening, and recommended immunizations.  Printed recommendations for health maintenance screenings was given      Hyperlipidemia    Based on last year's  lipid profile, the risk of clinically significant  CAD wass 9% over the next 10 years, using the Framingham risk calculator. Current 10 yr risk is 6.3%  No indication for therapy   Lab Results  Component Value Date   CHOL 236 (H) 06/02/2018   HDL 49.90 06/02/2018   LDLCALC 164 (H) 06/02/2018   LDLDIRECT 226.5 09/28/2013   TRIG 111.0 06/02/2018   CHOLHDL 5 06/02/2018        Relevant Orders   Lipid panel (Completed)   Long-term use of high-risk medication    Annual pans elvic  PAP smear was done today       Mass of perineum, female    Presumed by MRI to be a lipoma,  But given its size,  Surgical excision is planned,  By [facility]       Obesity (BMI 30-39.9)    I have addressed  BMI and recommended a low glycemic index diet utilizing smaller more frequent meals to increase metabolism.  I have also recommended that patient start exercising with a goal of 30 minutes of  aerobic exercise a minimum of 5 days per week. Screening for lipid disorders, thyroid and diabetes to be done today.  Lab Results  Component Value Date   TSH 1.76 06/02/2018   Lab Results  Component Value Date   CHOL 236 (H) 06/02/2018   HDL 49.90 06/02/2018   LDLCALC 164 (H) 06/02/2018   LDLDIRECT 226.5 09/28/2013   TRIG 111.0 06/02/2018   CHOLHDL 5 06/02/2018         Relevant Orders   Comprehensive metabolic panel (Completed)   TSH (Completed)    Other Visit Diagnoses    Cervical cancer screening    -  Primary   Relevant Orders   Cytology - PAP   Colon cancer screening       Relevant Orders   Ambulatory referral to Gastroenterology      I am having Amy Oliver. Amy Oliver maintain her Difluprednate, Cholecalciferol (BIO-D-MULSION PO), aspirin, and tamoxifen.  No orders of the defined types were placed in this encounter.   There are no discontinued medications.  Follow-up: No follow-ups on file.   Crecencio Mc, MD

## 2018-06-02 NOTE — Patient Instructions (Signed)
Health Maintenance for Postmenopausal Women Menopause is a normal process in which your reproductive ability comes to an end. This process happens gradually over a span of months to years, usually between the ages of 22 and 9. Menopause is complete when you have missed 12 consecutive menstrual periods. It is important to talk with your health care provider about some of the most common conditions that affect postmenopausal women, such as heart disease, cancer, and bone loss (osteoporosis). Adopting a healthy lifestyle and getting preventive care can help to promote your health and wellness. Those actions can also lower your chances of developing some of these common conditions. What should I know about menopause? During menopause, you may experience a number of symptoms, such as:  Moderate-to-severe hot flashes.  Night sweats.  Decrease in sex drive.  Mood swings.  Headaches.  Tiredness.  Irritability.  Memory problems.  Insomnia.  Choosing to treat or not to treat menopausal changes is an individual decision that you make with your health care provider. What should I know about hormone replacement therapy and supplements? Hormone therapy products are effective for treating symptoms that are associated with menopause, such as hot flashes and night sweats. Hormone replacement carries certain risks, especially as you become older. If you are thinking about using estrogen or estrogen with progestin treatments, discuss the benefits and risks with your health care provider. What should I know about heart disease and stroke? Heart disease, heart attack, and stroke become more likely as you age. This may be due, in part, to the hormonal changes that your body experiences during menopause. These can affect how your body processes dietary fats, triglycerides, and cholesterol. Heart attack and stroke are both medical emergencies. There are many things that you can do to help prevent heart disease  and stroke:  Have your blood pressure checked at least every 1-2 years. High blood pressure causes heart disease and increases the risk of stroke.  If you are 53-22 years old, ask your health care provider if you should take aspirin to prevent a heart attack or a stroke.  Do not use any tobacco products, including cigarettes, chewing tobacco, or electronic cigarettes. If you need help quitting, ask your health care provider.  It is important to eat a healthy diet and maintain a healthy weight. ? Be sure to include plenty of vegetables, fruits, low-fat dairy products, and lean protein. ? Avoid eating foods that are high in solid fats, added sugars, or salt (sodium).  Get regular exercise. This is one of the most important things that you can do for your health. ? Try to exercise for at least 150 minutes each week. The type of exercise that you do should increase your heart rate and make you sweat. This is known as moderate-intensity exercise. ? Try to do strengthening exercises at least twice each week. Do these in addition to the moderate-intensity exercise.  Know your numbers.Ask your health care provider to check your cholesterol and your blood glucose. Continue to have your blood tested as directed by your health care provider.  What should I know about cancer screening? There are several types of cancer. Take the following steps to reduce your risk and to catch any cancer development as early as possible. Breast Cancer  Practice breast self-awareness. ? This means understanding how your breasts normally appear and feel. ? It also means doing regular breast self-exams. Let your health care provider know about any changes, no matter how small.  If you are 40  or older, have a clinician do a breast exam (clinical breast exam or CBE) every year. Depending on your age, family history, and medical history, it may be recommended that you also have a yearly breast X-ray (mammogram).  If you  have a family history of breast cancer, talk with your health care provider about genetic screening.  If you are at high risk for breast cancer, talk with your health care provider about having an MRI and a mammogram every year.  Breast cancer (BRCA) gene test is recommended for women who have family members with BRCA-related cancers. Results of the assessment will determine the need for genetic counseling and BRCA1 and for BRCA2 testing. BRCA-related cancers include these types: ? Breast. This occurs in males or females. ? Ovarian. ? Tubal. This may also be called fallopian tube cancer. ? Cancer of the abdominal or pelvic lining (peritoneal cancer). ? Prostate. ? Pancreatic.  Cervical, Uterine, and Ovarian Cancer Your health care provider may recommend that you be screened regularly for cancer of the pelvic organs. These include your ovaries, uterus, and vagina. This screening involves a pelvic exam, which includes checking for microscopic changes to the surface of your cervix (Pap test).  For women ages 21-65, health care providers may recommend a pelvic exam and a Pap test every three years. For women ages 79-65, they may recommend the Pap test and pelvic exam, combined with testing for human papilloma virus (HPV), every five years. Some types of HPV increase your risk of cervical cancer. Testing for HPV may also be done on women of any age who have unclear Pap test results.  Other health care providers may not recommend any screening for nonpregnant women who are considered low risk for pelvic cancer and have no symptoms. Ask your health care provider if a screening pelvic exam is right for you.  If you have had past treatment for cervical cancer or a condition that could lead to cancer, you need Pap tests and screening for cancer for at least 20 years after your treatment. If Pap tests have been discontinued for you, your risk factors (such as having a new sexual partner) need to be  reassessed to determine if you should start having screenings again. Some women have medical problems that increase the chance of getting cervical cancer. In these cases, your health care provider may recommend that you have screening and Pap tests more often.  If you have a family history of uterine cancer or ovarian cancer, talk with your health care provider about genetic screening.  If you have vaginal bleeding after reaching menopause, tell your health care provider.  There are currently no reliable tests available to screen for ovarian cancer.  Lung Cancer Lung cancer screening is recommended for adults 69-62 years old who are at high risk for lung cancer because of a history of smoking. A yearly low-dose CT scan of the lungs is recommended if you:  Currently smoke.  Have a history of at least 30 pack-years of smoking and you currently smoke or have quit within the past 15 years. A pack-year is smoking an average of one pack of cigarettes per day for one year.  Yearly screening should:  Continue until it has been 15 years since you quit.  Stop if you develop a health problem that would prevent you from having lung cancer treatment.  Colorectal Cancer  This type of cancer can be detected and can often be prevented.  Routine colorectal cancer screening usually begins at  age 42 and continues through age 45.  If you have risk factors for colon cancer, your health care provider may recommend that you be screened at an earlier age.  If you have a family history of colorectal cancer, talk with your health care provider about genetic screening.  Your health care provider may also recommend using home test kits to check for hidden blood in your stool.  A small camera at the end of a tube can be used to examine your colon directly (sigmoidoscopy or colonoscopy). This is done to check for the earliest forms of colorectal cancer.  Direct examination of the colon should be repeated every  5-10 years until age 71. However, if early forms of precancerous polyps or small growths are found or if you have a family history or genetic risk for colorectal cancer, you may need to be screened more often.  Skin Cancer  Check your skin from head to toe regularly.  Monitor any moles. Be sure to tell your health care provider: ? About any new moles or changes in moles, especially if there is a change in a mole's shape or color. ? If you have a mole that is larger than the size of a pencil eraser.  If any of your family members has a history of skin cancer, especially at a young age, talk with your health care provider about genetic screening.  Always use sunscreen. Apply sunscreen liberally and repeatedly throughout the day.  Whenever you are outside, protect yourself by wearing long sleeves, pants, a wide-brimmed hat, and sunglasses.  What should I know about osteoporosis? Osteoporosis is a condition in which bone destruction happens more quickly than new bone creation. After menopause, you may be at an increased risk for osteoporosis. To help prevent osteoporosis or the bone fractures that can happen because of osteoporosis, the following is recommended:  If you are 46-71 years old, get at least 1,000 mg of calcium and at least 600 mg of vitamin D per day.  If you are older than age 55 but younger than age 65, get at least 1,200 mg of calcium and at least 600 mg of vitamin D per day.  If you are older than age 54, get at least 1,200 mg of calcium and at least 800 mg of vitamin D per day.  Smoking and excessive alcohol intake increase the risk of osteoporosis. Eat foods that are rich in calcium and vitamin D, and do weight-bearing exercises several times each week as directed by your health care provider. What should I know about how menopause affects my mental health? Depression may occur at any age, but it is more common as you become older. Common symptoms of depression  include:  Low or sad mood.  Changes in sleep patterns.  Changes in appetite or eating patterns.  Feeling an overall lack of motivation or enjoyment of activities that you previously enjoyed.  Frequent crying spells.  Talk with your health care provider if you think that you are experiencing depression. What should I know about immunizations? It is important that you get and maintain your immunizations. These include:  Tetanus, diphtheria, and pertussis (Tdap) booster vaccine.  Influenza every year before the flu season begins.  Pneumonia vaccine.  Shingles vaccine.  Your health care provider may also recommend other immunizations. This information is not intended to replace advice given to you by your health care provider. Make sure you discuss any questions you have with your health care provider. Document Released: 10/01/2005  Document Revised: 02/27/2016 Document Reviewed: 05/13/2015 Elsevier Interactive Patient Education  2018 Elsevier Inc.  

## 2018-06-03 NOTE — Assessment & Plan Note (Signed)
I have addressed  BMI and recommended a low glycemic index diet utilizing smaller more frequent meals to increase metabolism.  I have also recommended that patient start exercising with a goal of 30 minutes of aerobic exercise a minimum of 5 days per week. Screening for lipid disorders, thyroid and diabetes to be done today.  Lab Results  Component Value Date   TSH 1.76 06/02/2018   Lab Results  Component Value Date   CHOL 236 (H) 06/02/2018   HDL 49.90 06/02/2018   LDLCALC 164 (H) 06/02/2018   LDLDIRECT 226.5 09/28/2013   TRIG 111.0 06/02/2018   CHOLHDL 5 06/02/2018

## 2018-06-03 NOTE — Assessment & Plan Note (Signed)
Based on last year's  lipid profile, the risk of clinically significant CAD wass 9% over the next 10 years, using the Framingham risk calculator. Current 10 yr risk is 6.3%  No indication for therapy   Lab Results  Component Value Date   CHOL 236 (H) 06/02/2018   HDL 49.90 06/02/2018   LDLCALC 164 (H) 06/02/2018   LDLDIRECT 226.5 09/28/2013   TRIG 111.0 06/02/2018   CHOLHDL 5 06/02/2018

## 2018-06-03 NOTE — Assessment & Plan Note (Signed)
Annual pans elvic  PAP smear was done today

## 2018-06-03 NOTE — Assessment & Plan Note (Signed)
By surgical biopsy in 2015,  With strong FH of breast CA.  Lifetime risk of CA using GAIL model is 21% per surgical onc evaluation at [facility] and She has been taking and tolerating tamoxifen since August 2015 an receiving annual follow up at the high risk breast clinic at [facility]

## 2018-06-03 NOTE — Assessment & Plan Note (Signed)
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

## 2018-06-03 NOTE — Assessment & Plan Note (Signed)
Annual comprehensive preventive exam was done as well as an evaluation and management of chronic conditions .  During the course of the visit the patient was educated and counseled about appropriate screening and preventive services including :  diabetes screening, lipid analysis with projected  10 year  risk for CAD , nutrition counseling, breast, cervical and colorectal cancer screening, and recommended immunizations.  Printed recommendations for health maintenance screenings was given 

## 2018-06-06 ENCOUNTER — Encounter: Payer: Self-pay | Admitting: *Deleted

## 2018-06-07 ENCOUNTER — Encounter: Payer: Self-pay | Admitting: *Deleted

## 2018-06-07 ENCOUNTER — Encounter: Payer: Self-pay | Admitting: Internal Medicine

## 2018-06-07 DIAGNOSIS — R87610 Atypical squamous cells of undetermined significance on cytologic smear of cervix (ASC-US): Secondary | ICD-10-CM | POA: Insufficient documentation

## 2018-06-09 ENCOUNTER — Telehealth: Payer: Self-pay | Admitting: Internal Medicine

## 2018-06-09 NOTE — Telephone Encounter (Signed)
Copied from [place] [id number]. Topic: General - Other >> Jun 09, 2018 10:05 AM Berneta Levins wrote: Reason for CRM:   PT states that Dr. Derrel Nip has recommended that she see a surgeon and the surgeon needs her recent labwork faxed over.   Please fax to:  Dr. Zerita Boers [phone].

## 2018-06-12 LAB — CYTOLOGY - PAP
Bacterial vaginitis: NEGATIVE
Chlamydia: NEGATIVE
Diagnosis: UNDETERMINED — AB
HPV: NOT DETECTED
NEISSERIA GONORRHEA: NEGATIVE
Trichomonas: NEGATIVE

## 2018-06-15 ENCOUNTER — Telehealth: Payer: Self-pay | Admitting: *Deleted

## 2018-06-15 NOTE — Telephone Encounter (Signed)
Copied from [place] [id number]. Topic: General - Other >> Jun 15, 2018  3:24 PM Cecelia Byars, NT wrote: Reason for CRM: Patient called said information was sent to faxed  over to Dr Maryfrances Bunnell   office was missing Dr Demetrios Isaacs comments/ notes  and the nurses comments please [phone] so  the patient so she can explain in detail

## 2018-06-21 ENCOUNTER — Other Ambulatory Visit: Payer: Self-pay

## 2018-06-21 DIAGNOSIS — Z1211 Encounter for screening for malignant neoplasm of colon: Secondary | ICD-10-CM

## 2018-06-26 NOTE — Telephone Encounter (Signed)
Spoke with pt and she asked for a copy of her pap smear results. Copy has been printed and placed up front for pt to come by and pick up per pt request.

## 2018-08-07 ENCOUNTER — Ambulatory Visit
Admission: RE | Admit: 2018-08-07 | Discharge: 2018-08-07 | Disposition: A | Discharge status: Home / Self Care | Payer: 59 | Attending: Gastroenterology | Admitting: Gastroenterology

## 2018-08-07 ENCOUNTER — Ambulatory Visit: Payer: 59 | Admitting: Certified Registered Nurse Anesthetist

## 2018-08-07 ENCOUNTER — Encounter
Admission: RE | Disposition: A | Discharge status: Home / Self Care | Payer: Self-pay | Source: Home / Self Care | Attending: Gastroenterology

## 2018-08-07 DIAGNOSIS — D123 Benign neoplasm of transverse colon: Secondary | ICD-10-CM | POA: Insufficient documentation

## 2018-08-07 DIAGNOSIS — Z79899 Other long term (current) drug therapy: Secondary | ICD-10-CM | POA: Diagnosis not present

## 2018-08-07 DIAGNOSIS — Z87442 Personal history of urinary calculi: Secondary | ICD-10-CM | POA: Diagnosis not present

## 2018-08-07 DIAGNOSIS — Z1211 Encounter for screening for malignant neoplasm of colon: Secondary | ICD-10-CM

## 2018-08-07 DIAGNOSIS — Z7982 Long term (current) use of aspirin: Secondary | ICD-10-CM | POA: Insufficient documentation

## 2018-08-07 HISTORY — PX: COLONOSCOPY WITH PROPOFOL: SHX5780

## 2018-08-07 SURGERY — COLONOSCOPY WITH PROPOFOL
Anesthesia: General

## 2018-08-07 MED ORDER — LIDOCAINE HCL (PF) 2 % IJ SOLN
INTRAMUSCULAR | Status: AC
  Filled 2018-08-07: qty 10

## 2018-08-07 MED ORDER — SODIUM CHLORIDE 0.9 % IV SOLN
INTRAVENOUS | Status: DC
  Administered 2018-08-07: 08:00:00 via INTRAVENOUS

## 2018-08-07 MED ORDER — PROPOFOL 10 MG/ML IV BOLUS
INTRAVENOUS | Status: AC
  Filled 2018-08-07: qty 20

## 2018-08-07 MED ORDER — PROPOFOL 500 MG/50ML IV EMUL
INTRAVENOUS | Status: AC
  Filled 2018-08-07: qty 50

## 2018-08-07 MED ORDER — LIDOCAINE HCL (CARDIAC) PF 100 MG/5ML IV SOSY
PREFILLED_SYRINGE | INTRAVENOUS | Status: DC | PRN
  Administered 2018-08-07: 50 mg via INTRAVENOUS

## 2018-08-07 MED ORDER — PROPOFOL 500 MG/50ML IV EMUL
INTRAVENOUS | Status: DC | PRN
  Administered 2018-08-07: 175 ug/kg/min via INTRAVENOUS

## 2018-08-07 MED ORDER — PROPOFOL 10 MG/ML IV BOLUS
INTRAVENOUS | Status: DC | PRN
  Administered 2018-08-07: 60 mg via INTRAVENOUS
  Administered 2018-08-07: 40 mg via INTRAVENOUS
  Administered 2018-08-07: 30 mg via INTRAVENOUS

## 2018-08-07 NOTE — H&P (Signed)
Cephas Darby, MD [address]  [place]  [place], [place] [address]  Main: [phone]  Fax: [phone] Pager: [phone]  Primary Care Physician:  Crecencio Mc, MD Primary Gastroenterologist:  Dr. Cephas Darby  Pre-Procedure History & Physical: HPI:  Amy Oliver is a 57 y.o. female is here for an colonoscopy.   Past Medical History:  Diagnosis Date  . History of nephrolithiasis     Past Surgical History:  Procedure Laterality Date  . 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 Stereotactic biopsy clip not removed during the procedure. Patient declined further surgical intervention. Follow-up at [facility]..  . CESAREAN SECTION     2  . CHOLECYSTECTOMY  2007  . LIPOMA EXCISION     labia  . TONSILLECTOMY      Prior to Admission medications   Medication Sig Start Date End Date Taking? Authorizing Provider  aspirin 81 MG tablet Take 81 mg by mouth daily.   Yes [provider]  tamoxifen (NOLVADEX) 20 MG tablet Take 20 mg by mouth daily.   Yes [provider]  Cholecalciferol (BIO-D-MULSION PO) Take 5 drops by mouth daily.     [provider]  Difluprednate (DUREZOL) 0.05 % EMUL Apply 2 drops to eye 3 (three) times daily as needed.     [provider]    Allergies as of 06/22/2018 - Review Complete 06/02/2018  Allergen Reaction Noted  . Penicillins Rash 01/11/2012    Family History  Problem Relation Age of Onset  . Hypertension Father   . Pulmonary fibrosis Father   . Diabetes Brother   . Breast cancer Paternal Grandmother   . Lung cancer Paternal Aunt   . Breast cancer Maternal Aunt     Social History   Socioeconomic History  . Marital status: Married    Spouse name: Not on file  . Number of children: Not on file  . Years of education: Not on file  . Highest education level: Not on file  Occupational History  . Occupation: Pension scheme manager: [employer]    Comment: non Facilities manager  Social Needs  . Financial resource strain: Not on file  . Food insecurity:    Worry: Not on file    Inability: Not on file  . Transportation needs:    Medical: Not on file    Non-medical: Not on file  Tobacco Use  . Smoking status: Never Smoker  . Smokeless tobacco: Never Used  Substance and Sexual Activity  . Alcohol use: Yes  . Drug use: No  . Sexual activity: Not on file  Lifestyle  . Physical activity:    Days per week: Not on file    Minutes per session: Not on file  . Stress: Not on file  Relationships  . Social connections:    Talks on phone: Not on file    Gets together: Not on file    Attends religious service: Not on file    Active member of club or organization: Not on file    Attends meetings of clubs or organizations: Not on file    Relationship status: Not on file  . Intimate partner violence:    Fear of current or ex partner: Not on file    Emotionally abused: Not on file    Physically abused: Not on file    Forced sexual activity: Not on file  Other Topics Concern  .  Not on file  Social History Narrative  . Not on file    Review of Systems: See HPI, otherwise negative ROS  Physical Exam: BP 127/79   Pulse 90   Temp 98.1 F (36.7 C) (Tympanic)   Resp 16   Ht 5\' 5"  (1.651 m)   Wt 95.7 kg   LMP 09/18/2015   SpO2 98%   BMI 35.11 kg/m  General:   Alert,  pleasant and cooperative in NAD Head:  Normocephalic and atraumatic. Neck:  Supple; no masses or thyromegaly. Lungs:  Clear throughout to auscultation.    Heart:  Regular rate and rhythm. Abdomen:  Soft, nontender and nondistended. Normal bowel sounds, without guarding, and without rebound.   Neurologic:  Alert and  oriented x4;  grossly normal neurologically.  Impression/Plan: Amy Oliver is here for an colonoscopy to be performed for colon cancer screening  Risks, benefits, limitations, and  alternatives regarding  colonoscopy have been reviewed with the patient.  Questions have been answered.  All parties agreeable.   Sherri Sear, MD  08/07/2018, 8:28 AM

## 2018-08-07 NOTE — OR Nursing (Signed)
Proximal transverse colon polyp lifted with  saline a total of 3 ml in 2 different sites.

## 2018-08-07 NOTE — Anesthesia Preprocedure Evaluation (Addendum)
Anesthesia Evaluation  Patient identified by MRN, date of birth, ID band Patient awake    Reviewed: Allergy & Precautions, H&P , NPO status , Patient's Chart, lab work & pertinent test results  Airway Mallampati: III       Dental  (+) Teeth Intact   Pulmonary neg pulmonary ROS, neg COPD,           Cardiovascular (-) angina(-) Past MI and (-) Cardiac Stents negative cardio ROS  (-) dysrhythmias      Neuro/Psych negative neurological ROS  negative psych ROS   GI/Hepatic negative GI ROS, Neg liver ROS,   Endo/Other  negative endocrine ROS  Renal/GU negative Renal ROS  negative genitourinary   Musculoskeletal   Abdominal   Peds  Hematology negative hematology ROS (+)   Anesthesia Other Findings Past Medical History: No date: History of nephrolithiasis  Past Surgical History: 08/09/2013: BREAST SURGERY; Right     Comment:  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 Stereotactic biopsy clip not              removed during the procedure. Patient declined further               surgical intervention. [facility] at [facility] No date: CESAREAN SECTION     Comment:  2 2007: CHOLECYSTECTOMY No date: LIPOMA EXCISION     Comment:  labia No date: TONSILLECTOMY  BMI    Body Mass Index:  35.11 kg/m      Reproductive/Obstetrics negative OB ROS                            Anesthesia Physical Anesthesia Plan  ASA: II  Anesthesia Plan: General   Post-op Pain Management:    Induction:   PONV Risk Score and Plan: Propofol infusion and TIVA  Airway Management Planned:   Additional Equipment:   Intra-op Plan:   Post-operative Plan:   Informed Consent: I have reviewed the patients History and Physical, chart, labs and discussed the procedure including the risks, benefits and alternatives for the proposed anesthesia with the patient or  authorized representative who has indicated his/her understanding and acceptance.   Dental Advisory Given  Plan Discussed with: Anesthesiologist, CRNA and Surgeon  Anesthesia Plan Comments:         Anesthesia Quick Evaluation

## 2018-08-07 NOTE — Anesthesia Procedure Notes (Signed)
Date/Time: 08/07/2018 8:29 AM Performed by: Johnna Acosta, CRNA Pre-anesthesia Checklist: Patient identified, Emergency Drugs available, Suction available, Patient being monitored and Timeout performed Patient Re-evaluated:Patient Re-evaluated prior to induction Oxygen Delivery Method: Nasal cannula Preoxygenation: Pre-oxygenation with 100% oxygen Induction Type: IV induction

## 2018-08-07 NOTE — OR Nursing (Signed)
A second polyp transverse colon lifted with Elaview  3 ml prior to removal with hot snare.  Clip also used at polypectomy site.

## 2018-08-07 NOTE — Transfer of Care (Signed)
Immediate Anesthesia Transfer of Care Note  Patient: Amy Oliver  Procedure(s) Performed: COLONOSCOPY WITH PROPOFOL (N/A )  Patient Location: PACU  Anesthesia Type:General  Level of Consciousness: awake  Airway & Oxygen Therapy: Patient Spontanous Breathing and Patient connected to nasal cannula oxygen  Post-op Assessment: Report given to RN and Post -op Vital signs reviewed and stable  Post vital signs: Reviewed and stable  Last Vitals:  Vitals Value Taken Time  BP 108/63 08/07/2018  9:22 AM  Temp 36.4 C 08/07/2018  9:22 AM  Pulse 77 08/07/2018  9:24 AM  Resp 12 08/07/2018  9:24 AM  SpO2 100 % 08/07/2018  9:24 AM  Vitals shown include unvalidated device data.  Last Pain:  Vitals:   08/07/18 0922  TempSrc: Tympanic  PainSc: Asleep         Complications: No apparent anesthesia complications

## 2018-08-07 NOTE — Anesthesia Post-op Follow-up Note (Signed)
Anesthesia QCDR form completed.        

## 2018-08-07 NOTE — Op Note (Signed)
[facility] Patient Name: Amy Oliver Procedure Date: 08/07/2018 8:13 AM MRN: [id number] Account #: [id number] Date of Birth: 1961-08-05 Admit Type: Outpatient Age: 57 Room: [facility] ENDO ROOM 2 Gender: Female Note Status: Finalized Procedure:            Colonoscopy Indications:          Screening for colorectal malignant neoplasm, This is                        the patient's first colonoscopy Providers:            Lin Landsman MD, MD Referring MD:         Deborra Medina, MD (Referring MD) Medicines:            Monitored Anesthesia Care Complications:        No immediate complications. Estimated blood loss: None. Procedure:            Pre-Anesthesia Assessment:                       - Prior to the procedure, a History and Physical was                        performed, and patient medications and allergies were                        reviewed. The patient is competent. The risks and                        benefits of the procedure and the sedation options and                        risks were discussed with the patient. All questions                        were answered and informed consent was obtained.                        Patient identification and proposed procedure were                        verified by the physician, the nurse, the                        anesthesiologist, the anesthetist and the technician in                        the pre-procedure area in the procedure room in the                        endoscopy suite. Mental Status Examination: alert and                        oriented. Airway Examination: normal oropharyngeal                        airway and neck mobility. Respiratory Examination:                        clear to auscultation. CV Examination: normal.  Prophylactic Antibiotics: The patient does not require                        prophylactic antibiotics. Prior Anticoagulants: The                         patient has taken no previous anticoagulant or                        antiplatelet agents. ASA Grade Assessment: II - A                        patient with mild systemic disease. After reviewing the                        risks and benefits, the patient was deemed in                        satisfactory condition to undergo the procedure. The                        anesthesia plan was to use monitored anesthesia care                        (MAC). Immediately prior to administration of                        medications, the patient was re-assessed for adequacy                        to receive sedatives. The heart rate, respiratory rate,                        oxygen saturations, blood pressure, adequacy of                        pulmonary ventilation, and response to care were                        monitored throughout the procedure. The physical status                        of the patient was re-assessed after the procedure.                       After obtaining informed consent, the colonoscope was                        passed under direct vision. Throughout the procedure,                        the patient's blood pressure, pulse, and oxygen                        saturations were monitored continuously. The                        Colonoscope was introduced through the anus and  advanced to the the cecum, identified by appendiceal                        orifice and ileocecal valve. The colonoscopy was                        performed with moderate difficulty due to the patient's                        body habitus. Successful completion of the procedure                        was aided by applying abdominal pressure. The patient                        tolerated the procedure well. The quality of the bowel                        preparation was evaluated using the BBPS [facility] Bowel                        Preparation Scale) with scores of: Right Colon = 3,                         Transverse Colon = 3 and Left Colon = 3 (entire mucosa                        seen well with no residual staining, small fragments of                        stool or opaque liquid). The total BBPS score equals 9. Findings:      The perianal and digital rectal examinations were normal. Pertinent       negatives include normal sphincter tone and no palpable rectal lesions.      The terminal ileum appeared normal.      A 6 mm polyp was found in the proximal transverse colon. The polyp was       flat. Preparations were made for mucosal resection. Saline was injected       to raise the lesion. Snare mucosal resection was performed. Resection       and retrieval were complete.      A diminutive polyp was found in the transverse colon. The polyp was       sessile. The polyp was removed with a cold biopsy forceps. Resection and       retrieval were complete.      A 10 mm polyp was found in the transverse colon. The polyp was flat.       Preparations were made for mucosal resection. Eleview was injected to       raise the lesion. Snare mucosal resection was performed. Resection and       retrieval were complete. To prevent bleeding after mucosal resection,       one hemostatic clip was successfully placed (MR conditional). There was       no bleeding during, or at the end, of the procedure.      The retroflexed view of the distal rectum and anal verge was normal and       showed no anal or rectal abnormalities.  The exam was otherwise without abnormality. Impression:           - The examined portion of the ileum was normal.                       - One 6 mm polyp in the proximal transverse colon,                        removed with mucosal resection. Resected and retrieved.                       - One diminutive polyp in the transverse colon, removed                        with a cold biopsy forceps. Resected and retrieved.                       - One 10 mm polyp in the  transverse colon, removed with                        mucosal resection. Resected and retrieved. Clip (MR                        conditional) was placed.                       - The distal rectum and anal verge are normal on                        retroflexion view.                       - The examination was otherwise normal.                       - Mucosal resection was performed. Resection and                        retrieval were complete.                       - Mucosal resection was performed. Resection and                        retrieval were complete. Recommendation:       - Discharge patient to home (with escort).                       - Resume previous diet today.                       - Continue present medications.                       - Await pathology results.                       - Repeat colonoscopy in 3 years for surveillance of                        multiple polyps. Procedure Code(s):    --- Professional ---  54492, Colonoscopy, flexible; with endoscopic mucosal                        resection                       45380, 44, Colonoscopy, flexible; with biopsy, single                        or multiple Diagnosis Code(s):    --- Professional ---                       Z12.11, Encounter for screening for malignant neoplasm                        of colon                       D12.3, Benign neoplasm of transverse colon (hepatic                        flexure or splenic flexure) CPT copyright 2018 American Medical Association. All rights reserved. The codes documented in this report are preliminary and upon coder review may  be revised to meet current compliance requirements. Dr. Ulyess Mort Lin Landsman MD, MD 08/07/2018 9:20:47 AM This report has been signed electronically. Number of Addenda: 0 Note Initiated On: 08/07/2018 8:13 AM Scope Withdrawal Time: 0 hours 33 minutes 6 seconds  Total Procedure Duration: 0 hours 41 minutes 19  seconds       [facility]

## 2018-08-08 ENCOUNTER — Encounter: Payer: Self-pay | Admitting: Gastroenterology

## 2018-08-08 LAB — SURGICAL PATHOLOGY

## 2018-08-08 NOTE — Anesthesia Postprocedure Evaluation (Signed)
Anesthesia Post Note  Patient: Amy Oliver  Procedure(s) Performed: COLONOSCOPY WITH PROPOFOL (N/A )  Patient location during evaluation: PACU Anesthesia Type: General Level of consciousness: awake and alert Pain management: pain level controlled Vital Signs Assessment: post-procedure vital signs reviewed and stable Respiratory status: spontaneous breathing, nonlabored ventilation, respiratory function stable and patient connected to nasal cannula oxygen Cardiovascular status: blood pressure returned to baseline and stable Postop Assessment: no apparent nausea or vomiting Anesthetic complications: no     Last Vitals:  Vitals:   08/07/18 0932 08/07/18 0942  BP: 114/66 116/65  Pulse: 66 65  Resp: 15 15  Temp:    SpO2: 100% 100%    Last Pain:  Vitals:   08/07/18 0942  TempSrc:   PainSc: 0-No pain                 Durenda Hurt

## 2019-06-08 ENCOUNTER — Encounter: Payer: 59 | Admitting: Internal Medicine

## 2019-06-15 ENCOUNTER — Encounter: Payer: 59 | Admitting: Internal Medicine

## 2019-07-10 ENCOUNTER — Other Ambulatory Visit: Payer: Self-pay

## 2019-07-10 DIAGNOSIS — Z20822 Contact with and (suspected) exposure to covid-19: Secondary | ICD-10-CM

## 2019-07-16 ENCOUNTER — Other Ambulatory Visit: Payer: Self-pay

## 2019-07-16 LAB — NOVEL CORONAVIRUS, NAA: SARS-CoV-2, NAA: NOT DETECTED

## 2019-07-23 ENCOUNTER — Encounter: Payer: 59 | Admitting: Internal Medicine

## 2019-08-10 ENCOUNTER — Other Ambulatory Visit: Payer: 59

## 2019-08-13 ENCOUNTER — Ambulatory Visit: Payer: Self-pay | Attending: Internal Medicine

## 2019-08-13 DIAGNOSIS — Z20822 Contact with and (suspected) exposure to covid-19: Secondary | ICD-10-CM

## 2019-08-14 LAB — NOVEL CORONAVIRUS, NAA: SARS-CoV-2, NAA: DETECTED — AB

## 2019-08-15 ENCOUNTER — Other Ambulatory Visit: Payer: Self-pay

## 2019-08-20 ENCOUNTER — Ambulatory Visit: Payer: BC Managed Care – PPO | Attending: Internal Medicine

## 2019-08-20 DIAGNOSIS — U071 COVID-19: Secondary | ICD-10-CM | POA: Insufficient documentation

## 2019-08-20 DIAGNOSIS — Z20822 Contact with and (suspected) exposure to covid-19: Secondary | ICD-10-CM

## 2019-08-22 ENCOUNTER — Telehealth: Payer: Self-pay | Admitting: Internal Medicine

## 2019-08-22 LAB — SPECIMEN STATUS REPORT

## 2019-08-22 LAB — NOVEL CORONAVIRUS, NAA: SARS-CoV-2, NAA: DETECTED — AB

## 2019-08-22 NOTE — Telephone Encounter (Signed)
Pt sent a message through [facility] and called in to ask question about Covid-19. Pt wants to know when she will no longer be contagious. Pt tested positive last week and this week also. Please advise.

## 2019-09-12 DIAGNOSIS — Z6835 Body mass index (BMI) 35.0-35.9, adult: Secondary | ICD-10-CM | POA: Diagnosis not present

## 2019-09-12 DIAGNOSIS — N6091 Unspecified benign mammary dysplasia of right breast: Secondary | ICD-10-CM | POA: Diagnosis not present

## 2019-09-12 DIAGNOSIS — Z1231 Encounter for screening mammogram for malignant neoplasm of breast: Secondary | ICD-10-CM | POA: Diagnosis not present

## 2019-09-12 DIAGNOSIS — N6099 Unspecified benign mammary dysplasia of unspecified breast: Secondary | ICD-10-CM | POA: Diagnosis not present

## 2019-09-12 DIAGNOSIS — Z803 Family history of malignant neoplasm of breast: Secondary | ICD-10-CM | POA: Diagnosis not present

## 2019-09-28 ENCOUNTER — Other Ambulatory Visit: Payer: Self-pay

## 2019-10-01 ENCOUNTER — Ambulatory Visit (INDEPENDENT_AMBULATORY_CARE_PROVIDER_SITE_OTHER): Payer: BC Managed Care – PPO | Admitting: Internal Medicine

## 2019-10-01 ENCOUNTER — Encounter: Payer: Self-pay | Admitting: Internal Medicine

## 2019-10-01 ENCOUNTER — Other Ambulatory Visit (HOSPITAL_COMMUNITY)
Admission: RE | Admit: 2019-10-01 | Discharge: 2019-10-01 | Disposition: A | Discharge status: Home / Self Care | Payer: BC Managed Care – PPO | Source: Ambulatory Visit | Attending: Internal Medicine | Admitting: Internal Medicine

## 2019-10-01 ENCOUNTER — Other Ambulatory Visit: Payer: Self-pay

## 2019-10-01 VITALS — BP 124/76 | HR 59 | Temp 97.2°F | Resp 15 | Ht 65.0 in | Wt 212.4 lb

## 2019-10-01 DIAGNOSIS — Z1152 Encounter for screening for COVID-19: Secondary | ICD-10-CM

## 2019-10-01 DIAGNOSIS — E782 Mixed hyperlipidemia: Secondary | ICD-10-CM | POA: Diagnosis not present

## 2019-10-01 DIAGNOSIS — E669 Obesity, unspecified: Secondary | ICD-10-CM | POA: Diagnosis not present

## 2019-10-01 DIAGNOSIS — Z124 Encounter for screening for malignant neoplasm of cervix: Secondary | ICD-10-CM | POA: Diagnosis not present

## 2019-10-01 DIAGNOSIS — Z20822 Contact with and (suspected) exposure to covid-19: Secondary | ICD-10-CM | POA: Diagnosis not present

## 2019-10-01 DIAGNOSIS — R87610 Atypical squamous cells of undetermined significance on cytologic smear of cervix (ASC-US): Secondary | ICD-10-CM

## 2019-10-01 DIAGNOSIS — Z0000 Encounter for general adult medical examination without abnormal findings: Secondary | ICD-10-CM | POA: Diagnosis not present

## 2019-10-01 DIAGNOSIS — N6099 Unspecified benign mammary dysplasia of unspecified breast: Secondary | ICD-10-CM

## 2019-10-01 DIAGNOSIS — Z8616 Personal history of COVID-19: Secondary | ICD-10-CM

## 2019-10-01 NOTE — Assessment & Plan Note (Signed)
Based on last year's  lipid profile, the risk of clinically significant CAD was  6.3%rs, using the Framingham risk calculator. Repeat lipids today

## 2019-10-01 NOTE — Assessment & Plan Note (Signed)
She continues to exhibit signs and symptoms of atrophy due to use of tamoxifen.  Referral to [facility] for management of  dyspareunia

## 2019-10-01 NOTE — Progress Notes (Signed)
Patient ID: Amy Oliver, female    DOB: January 26, 1961  Age: 59 y.o. MRN: [id number]  The patient is here for annual preventive  examination and management of other chronic and acute problems.  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    The risk factors are reflected in the social history.  The roster of all physicians providing medical care to patient - is listed in the Snapshot section of the chart.  Activities of daily living:  The patient is 100% independent in all ADLs: dressing, toileting, feeding as well as independent mobility  Home safety : The patient has smoke detectors in the home. They wear seatbelts.  There are no firearms at home. There is no violence in the home.   There is no risks for hepatitis, STDs or HIV. There is no   history of blood transfusion. They have no travel history to infectious disease endemic areas of the world.  The patient has seen their dentist in the last six month. They have seen their eye doctor in the last year. They DENYt hearing difficulty with regard to whispered voices and some television programs.  They have deferred audiologic testing in the last year.  They do not  have excessive sun exposure. Discussed the need for sun protection: hats, long sleeves and use of sunscreen if there is significant sun exposure.   Diet: the importance of a healthy diet is discussed. They do have a healthy diet.  The benefits of regular aerobic exercise were discussed. She walks 4 times per week ,  20 minutes.   Depression screen: there are no signs or vegative symptoms of depression- irritability, change in appetite, anhedonia, sadness/tearfullness.   The following portions of the patient's history were reviewed and updated as appropriate: allergies, current medications, past family history, past medical history,  past surgical history,  past social history  and problem list.  Visual acuity was not assessed per patient preference since she has regular follow up with her ophthalmologist. Hearing and body mass index were assessed and reviewed.   During the course of the visit the patient was educated and counseled about appropriate screening and preventive services including : fall prevention , diabetes screening, nutrition counseling, colorectal cancer screening, and recommended immunizations.    CC: The primary encounter diagnosis was Mixed hyperlipidemia. Diagnoses of Cervical cancer screening, Encounter for screening laboratory testing for COVID-19 virus, Obesity (BMI 30-39.9), Atypical hyperplasia of breast, Encounter for preventive health examination, ASCUS of cervix with negative high risk HPV, and History of COVID-19 were also pertinent to this visit.  History Amy Oliver has a past medical history of History of nephrolithiasis.   She has a past surgical history that includes Cholecystectomy (2007); Tonsillectomy; Cesarean section; Breast surgery (Right, 08/09/2013); Lipoma excision; and Colonoscopy with propofol (N/A, 08/07/2018).   Her family history includes Breast cancer in her maternal aunt and paternal grandmother; Diabetes in her brother; Hypertension in her father; Lung cancer in her paternal aunt; Pulmonary fibrosis in her father.She reports that she has never smoked. She has never used smokeless tobacco. She reports current alcohol use. She reports that she does not use drugs.  Outpatient Medications Prior to Visit  Medication Sig Dispense Refill  . Cholecalciferol (BIO-D-MULSION PO) Take 5 drops by mouth daily.     . Difluprednate (DUREZOL) 0.05 % EMUL Apply 2 drops to eye  3 (three) times daily as needed.     Marland Kitchen aspirin 81 MG tablet Take 81 mg by mouth daily.    . tamoxifen (NOLVADEX) 20 MG tablet Take 20 mg by mouth daily.     No facility-administered medications prior to visit.    Review of  Systems  Objective:  BP 124/76 (BP Location: Left Arm, Patient Position: Sitting, Cuff Size: Large)   Pulse (!) 59   Temp (!) 97.2 F (36.2 C) (Temporal)   Resp 15   Ht 5\' 5"  (1.651 m)   Wt 212 lb 6.4 oz (96.3 kg)   LMP 09/18/2015   SpO2 97%   BMI 35.35 kg/m   Physical Exam    Assessment & Plan:   Problem List Items Addressed This Visit      Unprioritized   Obesity (BMI 30-39.9)    She has gained weight over the past year and is now actively dieting  isogenics plan    encouarged to start walking program       Atypical hyperplasia of breast    By surgical biopsy in 2015,  With strong FH of breast CA.  Lifetime risk of CA using GAIL model is 21% per surgical onc evaluation at [facility] and She received  Tamoxifen for five years,  From  August 2015 to 2020.  And is  receiving annual follow up at the high risk breast clinic at [facility] every january      Encounter for preventive health examination    age appropriate education and counseling updated, referrals for preventative services and immunizations addressed, dietary and smoking counseling addressed, most recent labs reviewed.  I have personally reviewed and have noted:  1) the patient's medical and social history 2) The pt's use of alcohol, tobacco, and illicit drugs 3) The patient's current medications and supplements 4) Functional ability including ADL's, fall risk, home safety risk, hearing and visual impairment 5) Diet and physical activities 6) Evidence for depression or mood disorder 7) The patient's height, weight, and BMI have been recorded in the chart  I have made referrals, and provided counseling and education based on review of the above      Hyperlipidemia - Primary    Based on last year's  lipid profile, the risk of clinically significant CAD was  6.3%rs, using the Framingham risk calculator. Repeat lipids today        Relevant Orders   Comprehensive metabolic panel   Lipid panel   TSH   ASCUS of cervix  with negative high risk HPV    She continues to exhibit signs and symptoms of atrophy due to use of tamoxifen.  Referral to [facility] for management of  dyspareunia      History of COVID-19    She had a mild case in December  .  Antibody testing requested today        Other Visit Diagnoses    Cervical cancer screening       Relevant Orders   Cytology - PAP( [place])   Encounter for screening laboratory testing for COVID-19 virus       Relevant Orders   SARS CoV2 Serology(COVID19) AB(IgG,IgM),Immunoassay      I have discontinued Ebony Hail D. Shankles's aspirin and tamoxifen. I am also having her maintain her Difluprednate and Cholecalciferol (BIO-D-MULSION PO).  No orders of the defined types were placed in this encounter.   Medications Discontinued During This Encounter  Medication Reason  . aspirin 81 MG tablet Patient has not taken  in last 30 days  . tamoxifen (NOLVADEX) 20 MG tablet Completed Course    Follow-up: No follow-ups on file.   Crecencio Mc, MD

## 2019-10-01 NOTE — Assessment & Plan Note (Signed)
By surgical biopsy in 2015,  With strong FH of breast CA.  Lifetime risk of CA using GAIL model is 21% per surgical onc evaluation at [facility] and She received  Tamoxifen for five years,  From  August 2015 to 2020.  And is  receiving annual follow up at the high risk breast clinic at [facility] every january 

## 2019-10-01 NOTE — Assessment & Plan Note (Signed)

## 2019-10-01 NOTE — Patient Instructions (Signed)
REferrral to Amy Days, MD for GYN issues    Dyspareunia, Female Dyspareunia is pain that is associated with sexual activity. This can affect any part of the genitals or lower abdomen. There are many possible causes of this condition. In some cases, diagnosing the cause of dyspareunia can be difficult. This condition can be mild, moderate, or severe. Depending on the cause, dyspareunia may get better with treatment, but may return (recur) over time. What are the causes?  The cause of this condition is not always known. However, problems that affect the vulva, vagina, uterus, and other organs may cause dyspareunia. Common causes of this condition include:  Vaginal dryness.  Giving birth.  Infection.  Skin changes or conditions.  Side effects of medicines.  Endometriosis. This is when tissue that is like the lining of the uterus grows on the outside of the uterus.  Psychological conditions. These include depression, anxiety, or traumatic experiences.  Allergic reaction. What increases the risk? The following factors may make you more likely to develop this condition:  History of physical or sexual trauma.  Some medicines.  No longer having a monthly period (menopause).  Having recently given birth.  Taking baths using soaps that have perfumes. These can cause irritation.  Douching. What are the signs or symptoms? The main symptom of this condition is pain in any part of your genitals or lower abdomen during or after sex. This may include:  Irritation, burning, or stinging sensations in your vulva.  Discomfort when your vulva or surrounding area is touched.  Aching and throbbing pain that may be constant.  Pain that gets worse when something is inserted into your vagina. How is this diagnosed? This condition may be diagnosed based on:  Your symptoms, including where and when your pain occurs.  Your medical history.  A physical exam. A pelvic exam will most  likely be done.  Tests that include ultrasound, blood tests, and tests that check the body for infection.  Imaging tests, such as X-ray, MRI, and CT scan. You may be referred to a health care provider who specializes in women's health (gynecologist). How is this treated? Treatment depends on the cause of your condition and your symptoms. In most cases, you may need to stop sexual activity until your symptoms go away or get better. Treatment may include:  Lubricants, ointments, and creams.  Physical therapy.  Massage therapy.  Hormonal therapy.  Medicines to: ? Prevent or fight infection. ? Relieve pain. ? Help numb the area. ? Treat depression (antidepressants).  Counseling, which may include sex therapy.  Surgery. Follow these instructions at home: Lifestyle  Wear cotton underwear.  Use water-based lubricants as needed during sex. Avoid oil-based lubricants.  Do not use any products that can cause irritation. This may include certain condoms, spermicides, lubricants, soaps, tampons, vaginal sprays, or douches.  Always practice safe sex. Use a condom to prevent sexually transmitted infections (STIs).  Talk freely with your partner about your condition. General instructions  Take or apply over-the-counter and prescription medicines only as told by your health care provider.  Urinate before you have sex.  Consider joining a support group.  Get the results of any tests you have done. Ask your health care provider, or the department that is doing the procedure, when your results will be ready.  Keep all follow-up visits as told by your health care provider. This is important. Contact a health care provider if:  You have vaginal bleeding after having sex.  You develop  a lump at the opening of your vagina even if the lump is painless.  You have: ? Abnormal discharge from your vagina. ? Vaginal dryness. ? Itchiness or irritation of your vulva or vagina. ? A new  rash. ? Symptoms that get worse or do not improve with treatment. ? A fever. ? Pain when you urinate. ? Blood in your urine. Get help right away if:  You have severe pain in your abdomen during or shortly after sex.  You pass out after sex. Summary  Dyspareunia is pain that is associated with sexual activity. This can affect any part of the genitals or lower abdomen.  There are many causes of this condition. Treatment depends on the cause and your symptoms. In most cases, you may need to stop sexual activity until your symptoms improve.  Take or apply over-the-counter and prescription medicines only as told by your health care provider.  Contact a health care provider if your symptoms get worse or do not improve with treatment.  Keep all follow-up visits as told by your health care provider. This is important. This information is not intended to replace advice given to you by your health care provider. Make sure you discuss any questions you have with your health care provider. Document Revised: 10/16/2018 Document Reviewed: 10/16/2018 Elsevier Patient Education  [place].

## 2019-10-01 NOTE — Assessment & Plan Note (Addendum)
She has gained weight over the past year and is now actively dieting  isogenics plan    encouarged to start walking program

## 2019-10-01 NOTE — Assessment & Plan Note (Signed)
She had a mild case in December  .  Antibody testing requested today

## 2019-10-02 LAB — COMPREHENSIVE METABOLIC PANEL
ALT: 18 U/L (ref 0–35)
AST: 19 U/L (ref 0–37)
Albumin: 4.5 g/dL (ref 3.5–5.2)
Alkaline Phosphatase: 74 U/L (ref 39–117)
BUN: 16 mg/dL (ref 6–23)
CO2: 27 mEq/L (ref 19–32)
Calcium: 9.8 mg/dL (ref 8.4–10.5)
Chloride: 102 mEq/L (ref 96–112)
Creatinine, Ser: 0.79 mg/dL (ref 0.40–1.20)
GFR: 75.17 mL/min (ref >60.00)
Glucose, Bld: 89 mg/dL (ref 70–99)
Potassium: 4.5 mEq/L (ref 3.5–5.1)
Sodium: 137 mEq/L (ref 135–145)
Total Bilirubin: 0.4 mg/dL (ref 0.2–1.2)
Total Protein: 7.2 g/dL (ref 6.0–8.3)

## 2019-10-02 LAB — LIPID PANEL
Cholesterol: 267 mg/dL — ABNORMAL HIGH (ref 0–200)
HDL: 48.7 mg/dL (ref >39.00)
LDL Cholesterol: 196 mg/dL — ABNORMAL HIGH (ref 0–99)
NonHDL: 218.44
Total CHOL/HDL Ratio: 5
Triglycerides: 112 mg/dL (ref 0.0–149.0)
VLDL: 22.4 mg/dL (ref 0.0–40.0)

## 2019-10-02 LAB — TSH: TSH: 1.51 u[IU]/mL (ref 0.35–4.50)

## 2019-10-02 LAB — SARS COV-2 SEROLOGY(COVID-19)AB(IGG,IGM),IMMUNOASSAY
SARS CoV-2 AB IgG: POSITIVE — AB
SARS CoV-2 IgM: POSITIVE — AB

## 2019-10-03 LAB — CYTOLOGY - PAP
Chlamydia: NEGATIVE
Comment: NEGATIVE
Comment: NEGATIVE
Comment: NORMAL
Diagnosis: NEGATIVE
Neisseria Gonorrhea: NEGATIVE
Trichomonas: NEGATIVE

## 2019-10-15 DIAGNOSIS — L578 Other skin changes due to chronic exposure to nonionizing radiation: Secondary | ICD-10-CM | POA: Diagnosis not present

## 2019-10-15 DIAGNOSIS — Z86018 Personal history of other benign neoplasm: Secondary | ICD-10-CM | POA: Diagnosis not present

## 2019-10-31 ENCOUNTER — Emergency Department
Admission: EM | Admit: 2019-10-31 | Discharge: 2019-10-31 | Disposition: A | Discharge status: Home / Self Care | Payer: BC Managed Care – PPO | Attending: Emergency Medicine | Admitting: Emergency Medicine

## 2019-10-31 ENCOUNTER — Other Ambulatory Visit: Payer: Self-pay

## 2019-10-31 ENCOUNTER — Telehealth: Payer: Self-pay | Admitting: Internal Medicine

## 2019-10-31 ENCOUNTER — Emergency Department: Payer: BC Managed Care – PPO

## 2019-10-31 ENCOUNTER — Encounter: Payer: Self-pay | Admitting: Emergency Medicine

## 2019-10-31 DIAGNOSIS — K2970 Gastritis, unspecified, without bleeding: Secondary | ICD-10-CM | POA: Diagnosis not present

## 2019-10-31 DIAGNOSIS — R079 Chest pain, unspecified: Secondary | ICD-10-CM | POA: Diagnosis not present

## 2019-10-31 LAB — CBC
HCT: 42.3 % (ref 36.0–46.0)
Hemoglobin: 14 g/dL (ref 12.0–15.0)
MCH: 29.9 pg (ref 26.0–34.0)
MCHC: 33.1 g/dL (ref 30.0–36.0)
MCV: 90.4 fL (ref 80.0–100.0)
Platelets: 391 10*3/uL (ref 150–400)
RBC: 4.68 MIL/uL (ref 3.87–5.11)
RDW: 12.7 % (ref 11.5–15.5)
WBC: 6.8 10*3/uL (ref 4.0–10.5)
nRBC: 0 % (ref 0.0–0.2)

## 2019-10-31 LAB — BASIC METABOLIC PANEL
Anion gap: 9 (ref 5–15)
BUN: 19 mg/dL (ref 6–20)
CO2: 26 mmol/L (ref 22–32)
Calcium: 9.7 mg/dL (ref 8.9–10.3)
Chloride: 103 mmol/L (ref 98–111)
Creatinine, Ser: 0.77 mg/dL (ref 0.44–1.00)
GFR calc Af Amer: >60 mL/min (ref >60)
GFR calc non Af Amer: >60 mL/min (ref >60)
Glucose, Bld: 117 mg/dL — ABNORMAL HIGH (ref 70–99)
Potassium: 3.9 mmol/L (ref 3.5–5.1)
Sodium: 138 mmol/L (ref 135–145)

## 2019-10-31 LAB — TROPONIN I (HIGH SENSITIVITY)
Troponin I (High Sensitivity): <2 ng/L (ref <18)
Troponin I (High Sensitivity): <2 ng/L (ref <18)

## 2019-10-31 MED ORDER — FAMOTIDINE 20 MG PO TABS: 20.0000 mg | ORAL_TABLET | Freq: Every day | ORAL | 1 refills | Status: DC

## 2019-10-31 MED ORDER — SUCRALFATE 1 G PO TABS: 1.0000 g | ORAL_TABLET | Freq: Four times a day (QID) | ORAL | 0 refills | Status: DC

## 2019-10-31 MED ORDER — LIDOCAINE VISCOUS HCL 2 % MT SOLN
15.0000 mL | Freq: Once | OROMUCOSAL | Status: AC
  Administered 2019-10-31: 15 mL via OROMUCOSAL
  Filled 2019-10-31: qty 15

## 2019-10-31 MED ORDER — SODIUM CHLORIDE 0.9% FLUSH: 3.0000 mL | Freq: Once | INTRAVENOUS | Status: DC

## 2019-10-31 NOTE — Telephone Encounter (Signed)
Pt states that Dr Derrel Nip wanted her to call back if pain in chest continued. Pt states pain is worrying her-left side pain and in between shoulder blades. Transferred to Marjory Lies at [employer]

## 2019-10-31 NOTE — ED Provider Notes (Signed)
[facility] Emergency Department Provider Note  ____________________________________________   I have reviewed the triage vital signs and the nursing notes.   HISTORY  Chief Complaint Chest Pain   History limited by: Not Limited   HPI Amy Oliver is a 59 y.o. female who presents to the emergency department today because of concern for chest pain. It has been present for the past six weeks. Located in the central chest it feels like a knot. It does have some radiation to the back as well as her left arm. The pain does come and go. She has not noticed anything in particular that makes the pain worse. She has taken some ibuprofen which has helped relieve her pain. The patient states that she does not eat anything particularly spicy. The patient says it somewhat reminds her of the feeling she was having before she was diagnosed with gallbladder disease.    Records reviewed. Per medical record review patient has a history of HLD.  Past Medical History:  Diagnosis Date  . History of nephrolithiasis     Patient Active Problem List   Diagnosis Date Noted  . History of COVID-19 10/01/2019  . Encounter for screening colonoscopy   . ASCUS of cervix with negative high risk HPV 06/07/2018  . Edema of both lower extremities 04/18/2018  . Mass of perineum, female 03/24/2018  . Long-term use of high-risk medication 06/02/2017  . Perimenopausal atrophic vaginitis 06/02/2017  . Hyperlipidemia 01/18/2016  . 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 Encounter for preventive health examination 09/11/2013  . 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 S/P breast biopsy, right 07/31/2013  . 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 Obesity (BMI 30-39.9) 01/12/2012  . Family history of breast cancer 01/12/2012  . History of nephrolithiasis     Past Surgical History:  Procedure Laterality Date  . BREAST SURGERY Right 08/09/2013   Open biopsy for  microcalcifications identification of atypical ductal hyperplasia. Stereotactic biopsy clip not removed during the procedure. Patient declined further surgical intervention. Follow-up at [facility]..  . CESAREAN SECTION     2  . CHOLECYSTECTOMY  2007  . COLONOSCOPY WITH PROPOFOL N/A 08/07/2018   Procedure: COLONOSCOPY WITH PROPOFOL;  Surgeon: Lin Landsman, MD;  Location: [facility] ENDOSCOPY;  Service: Gastroenterology;  Laterality: N/A;  . LIPOMA EXCISION     labia  . TONSILLECTOMY      Prior to Admission medications   Medication Sig Start Date End Date Taking? Authorizing Provider  Cholecalciferol (BIO-D-MULSION PO) Take 5 drops by mouth daily.     [provider]  Difluprednate (DUREZOL) 0.05 % EMUL Apply 2 drops to eye 3 (three) times daily as needed.     [provider]    Allergies Penicillins  Family History  Problem Relation Age of Onset  . Hypertension Father   . Pulmonary fibrosis Father   . Diabetes Brother   . Breast cancer Paternal Grandmother   . Lung cancer Paternal Aunt   . Breast cancer Maternal Aunt     Social History Social History   Tobacco Use  . Smoking status: Never Smoker  . Smokeless tobacco: Never Used  Substance Use Topics  . Alcohol use: Yes  . Drug use: No    Review of Systems Constitutional: No fever/chills Eyes: No visual changes. ENT: No sore throat. Cardiovascular: Positive for chest pain. Respiratory: Denies shortness of breath. Gastrointestinal: No abdominal pain.  No nausea, no vomiting.  No diarrhea.   Genitourinary:  Negative for dysuria. Musculoskeletal: Positive for back pain. Positive for left arm pain. Skin: Negative for rash. Neurological: Negative for headaches, focal weakness or numbness.  ____________________________________________   PHYSICAL EXAM:  VITAL SIGNS: ED Triage Vitals  Enc Vitals Group     BP 10/31/19 1451 137/69     Pulse Rate 10/31/19 1451 81     Resp 10/31/19 1451 18     Temp  10/31/19 1451 98.3 F (36.8 C)     Temp Source 10/31/19 1451 Oral     SpO2 10/31/19 1451 97 %     Weight 10/31/19 1444 210 lb (95.3 kg)     Height 10/31/19 1444 _0  (1.651 m)     Head Circumference --      Peak Flow --      Pain Score 10/31/19 1444 3   Constitutional: Alert and oriented.  Eyes: Conjunctivae are normal.  ENT      Head: Normocephalic and atraumatic.      Nose: No congestion/rhinnorhea.      Mouth/Throat: Mucous membranes are moist.      Neck: No stridor. Hematological/Lymphatic/Immunilogical: No cervical lymphadenopathy. Cardiovascular: Normal rate, regular rhythm.  No murmurs, rubs, or gallops.  Respiratory: Normal respiratory effort without tachypnea nor retractions. Breath sounds are clear and equal bilaterally. No wheezes/rales/rhonchi. Gastrointestinal: Soft and non tender. No rebound. No guarding.  Genitourinary: Deferred Musculoskeletal: Normal range of motion in all extremities. No lower extremity edema. Neurologic:  Normal speech and language. No gross focal neurologic deficits are appreciated.  Skin:  Skin is warm, dry and intact. No rash noted. Psychiatric: Mood and affect are normal. Speech and behavior are normal. Patient exhibits appropriate insight and judgment.  ____________________________________________    LABS (pertinent positives/negatives)  Trop hs <2 BMP wnl except glu 117 CBC wbc 6.8, hgb 14.0, plt 391  ____________________________________________   EKG  I, Nance Pear, attending physician, personally viewed and interpreted this EKG  EKG Time: 1446 Rate: 83 Rhythm: normal sinus rhythm Axis: normal Intervals: qtc 455 QRS: narrow ST changes: no st elevation Impression: normal ekg   ____________________________________________    RADIOLOGY  CXR Normal study ____________________________________________   PROCEDURES  Procedures  ____________________________________________   INITIAL IMPRESSION / ASSESSMENT  AND PLAN / ED COURSE  Pertinent labs & imaging results that were available during my care of the patient were reviewed by me and considered in my medical decision making (see chart for details).   Patient presented to the emergency department today because of concerns for centralized chest pain that has been ongoing for the past 6 weeks.  Work-up here is reassuring that that is likely not a cardiac etiology.  Troponin negative x2.  Chest x-ray normal.  EKG without concerning findings.  Patient was given viscous lidocaine which did help her symptoms.  At this point I do think gastritis/esophagitis likely.  I discussed this with the patient.  Will discharge with antiacid and sucralfate.  ____________________________________________   FINAL CLINICAL IMPRESSION(S) / ED DIAGNOSES  Final diagnoses:  Gastritis, presence of bleeding unspecified, unspecified chronicity, unspecified gastritis type     Note: This dictation was prepared with Dragon dictation. Any transcriptional errors that result from this process are unintentional     Nance Pear, MD 10/31/19 [phone]

## 2019-10-31 NOTE — Discharge Instructions (Addendum)
Please seek medical attention for any high fevers, chest pain, shortness of breath, change in behavior, persistent vomiting, bloody stool or any other new or concerning symptoms.  

## 2019-10-31 NOTE — ED Triage Notes (Signed)
Pt presents to ED via POV with c/o "knot" to center of chest that radiates between shoulder blades. Pt states intermittent L arm "tingling", also intermittent sharp pain to L side of her chest. Pt states intermittent palpitation x 3-4 beats and "every once in a while it almost feels like it's skipping". Pt also c/o upset stomach today. Pt states symptoms x 6 weeks, was referred by [place].

## 2019-11-01 NOTE — Telephone Encounter (Signed)
Patient needs to be seen ASAP and I am out of the office  I cannot tell from your note what you advised patient.  She needs to go to ER if she is having chest pain

## 2019-11-01 NOTE — Telephone Encounter (Signed)
Patient was seen in ED yesterday for sx.

## 2019-11-01 NOTE — Telephone Encounter (Signed)
Patient Name: Amy Oliver Gender: Female DOB: [birth year] Age: 59 Y 45 M 27 D Return Phone Number: [phone] (Primary) Address: City/State/Zip: [place] [address] Client [facility] Client Site [place] Physician Deborra Medina - MD Contact Type Call Who Is Calling Patient / Member / Family / Caregiver Call Type Triage / Clinical Relationship To Patient Self Return Phone Number Please choose phone number Chief Complaint CHEST PAIN (>=21 years) - pain, pressure, heaviness or tightness Reason for Call Symptomatic / Request for Poth states she has chest pain, for 6 weeks, sometimes on the left side, left arm pain, between her shoulder blades and is worrying [facility] Translation No Nurse Assessment Nurse: Cox, RN, [employer] Date/Time (Eastern Time): 10/31/2019 2:22:08 PM Confirm and document reason for call. If symptomatic, describe symptoms. ---Caller states she has chest pain x 6 weeks, pain in left arm, pain between shoulder blades Has the patient had close contact with a person known or suspected to have the novel coronavirus illness OR traveled / lives in area with major community spread (including international travel) in the last 14 days from the onset of symptoms? * If Asymptomatic, screen for exposure and travel within the last 14 days. ---No Does the patient have any new or worsening symptoms? ---Yes Will a triage be completed? ---Yes Related visit to physician within the last 2 weeks? ---No Does the PT have any chronic conditions? (i.e. diabetes, asthma, this includes High risk factors for pregnancy, etc.) ---No Is this a behavioral health or substance abuse call? ---No Guidelines Guideline Title Affirmed Question Affirmed Notes Nurse Date/Time (Eastern Time) Chest Pain [1] Chest pain lasts > 5 minutes AND [2] age > 55 Cox,  RN, [employer] [id number] PMPLEASE NOTE: All timestamps contained within this report are represented as [place] Standard Time. CONFIDENTIALTY NOTICE: This fax transmission is intended only for the addressee. It contains information that is legally privileged, confidential or otherwise protected from use or disclosure. If you are not the intended recipient, you are strictly prohibited from reviewing, disclosing, copying using or disseminating any of this information or taking any action in reliance on or regarding this information. If you have received this fax in error, please notify us immediately by telephone so that we can arrange for its return to [place]. Phone: [phone], Toll-Free: [phone], Fax: [phone] Page: 2 of 2 Call Id: [id number] [place]. Time Eilene Ghazi Time) Disposition Final User 10/31/2019 2:21:19 PM Send to Urgent Cameron Sprang 10/31/2019 2:27:08 PM 911 Outcome Documentation Cox, RN, [employer] Reason: Caller declines calling 911. States she will go to ER. 10/31/2019 2:26:31 PM Call EMS 911 Now Yes Cox, RN, [employer] Caller Disagree/Comply Disagree Caller Understands Yes PreDisposition InappropriateToAsk Care Advice Given Per Guideline CALL EMS 911 NOW: * Immediate medical attention is needed. You need to hang up and call 911 (or an ambulance). CARE ADVICE given per Chest Pain (Adult) guideline.

## 2020-02-22 ENCOUNTER — Telehealth: Payer: Self-pay | Admitting: Internal Medicine

## 2020-02-22 NOTE — Telephone Encounter (Signed)
Spoken to patient, due to no available appointment and her being tested for covid. I instructed her to go to UC or a virtual visit on mychart. Pt was not having fever, chills, abdominal px, SOB, heart palpitations, sweating, vomiting. Patient is only having headaches, slight dizziness, and nausea.

## 2020-02-22 NOTE — Telephone Encounter (Signed)
Pt called to say that she is having headaches,dizzness,not sleeping Pt wanted to come by and have her BP checked Pt was tested for COVID on 02/20/20

## 2020-09-17 DIAGNOSIS — N6099 Unspecified benign mammary dysplasia of unspecified breast: Secondary | ICD-10-CM | POA: Diagnosis not present

## 2020-09-17 DIAGNOSIS — Z006 Encounter for examination for normal comparison and control in clinical research program: Secondary | ICD-10-CM | POA: Diagnosis not present

## 2020-09-17 DIAGNOSIS — Z1231 Encounter for screening mammogram for malignant neoplasm of breast: Secondary | ICD-10-CM | POA: Diagnosis not present

## 2020-09-17 DIAGNOSIS — Z803 Family history of malignant neoplasm of breast: Secondary | ICD-10-CM | POA: Diagnosis not present

## 2020-09-17 DIAGNOSIS — N6091 Unspecified benign mammary dysplasia of right breast: Secondary | ICD-10-CM | POA: Diagnosis not present

## 2020-10-01 ENCOUNTER — Encounter: Payer: Self-pay | Admitting: Internal Medicine

## 2020-10-01 ENCOUNTER — Ambulatory Visit (INDEPENDENT_AMBULATORY_CARE_PROVIDER_SITE_OTHER): Payer: BC Managed Care – PPO | Admitting: Internal Medicine

## 2020-10-01 ENCOUNTER — Other Ambulatory Visit: Payer: Self-pay

## 2020-10-01 DIAGNOSIS — E669 Obesity, unspecified: Secondary | ICD-10-CM

## 2020-10-01 DIAGNOSIS — D126 Benign neoplasm of colon, unspecified: Secondary | ICD-10-CM

## 2020-10-01 DIAGNOSIS — N6099 Unspecified benign mammary dysplasia of unspecified breast: Secondary | ICD-10-CM

## 2020-10-01 DIAGNOSIS — Z0000 Encounter for general adult medical examination without abnormal findings: Secondary | ICD-10-CM

## 2020-10-01 DIAGNOSIS — Z7189 Other specified counseling: Secondary | ICD-10-CM

## 2020-10-01 NOTE — Progress Notes (Signed)
Patient ID: Amy Oliver, female    DOB: 1960-11-08  Age: 60 y.o. MRN: [id number]  The patient is here for annual preventive  examination and management of other chronic and acute problems.  .This visit occurred during the SARS-CoV-2 public health emergency.  Safety protocols were in place, including screening questions prior to the visit, additional usage of staff PPE, and extensive cleaning of exam room while observing appropriate contact time as indicated for disinfecting solutions.     The risk factors are reflected in the social history.   Seeing [facility]  For management and surveillance of  atypical ducal hyperplasia. Mammogram was normal jan 26   And she has completed  5 yrs tamoxifen for FH of BRCA PAP smear normal 2021 Colonoscopy 2019 History of COVID infection    ,  Antibody positive Feb 2021.  Declines vaccine.     The roster of all physicians providing medical care to patient - is listed in the Snapshot section of the chart.  Activities of daily living:  The patient is 100% independent in all ADLs: dressing, toileting, feeding as well as independent mobility  Home safety : The patient has smoke detectors in the home. They wear seatbelts.  There are no firearms at home. There is no violence in the home.   There is no risks for hepatitis, STDs or HIV. There is no   history of blood transfusion. They have no travel history to infectious disease endemic areas of the world.  The patient has seen their dentist in the last six month. They have seen their eye doctor in the last year. They admit to slight hearing difficulty with regard to whispered voices and some television programs.  They have deferred audiologic testing in the last year.  They do not  have excessive sun exposure. Discussed the need for sun protection: hats, long sleeves and use of sunscreen if there is significant sun exposure.   Diet: the importance of a healthy diet is discussed. They do have a healthy  diet.  The benefits of regular aerobic exercise were discussed. She walks 4 times per week ,  20 minutes.   Depression screen: there are no signs or vegative symptoms of depression- irritability, change in appetite, anhedonia, sadness/tearfullness.  Cognitive assessment: the patient manages all their financial and personal affairs and is actively engaged. They could relate day,date,year and events; recalled 2/3 objects at 3 minutes; performed clock-face test normally.  The following portions of the patient's history were reviewed and updated as appropriate: allergies, current medications, past family history, past medical history,  past surgical history, past social history  and problem list.  Visual acuity was not assessed per patient preference since she has regular follow up with her ophthalmologist. Hearing and body mass index were assessed and reviewed.   During the course of the visit the patient was educated and counseled about appropriate screening and preventive services including : fall prevention , diabetes screening, nutrition counseling, colorectal cancer screening, and recommended immunizations.    CC: Diagnoses of Atypical hyperplasia of breast, Encounter for preventive health examination, Obesity (BMI 30-39.9), Serrated adenoma of colon, and Advice given about COVID-19 virus infection were pertinent to this visit.  History Amy Oliver has a past medical history of History of nephrolithiasis and Mass of perineum, female (03/24/2018).   She has a past surgical history that includes Cholecystectomy (2007); Tonsillectomy; Cesarean section; Breast surgery (Right, 08/09/2013); Lipoma excision (2019); and Colonoscopy with propofol (N/A, 08/07/2018).   Her family history  includes Breast cancer in her maternal aunt and paternal grandmother; Diabetes in her brother; Hypertension in her father; Lung cancer in her paternal aunt; Pulmonary fibrosis in her father.She reports that she has never  smoked. She has never used smokeless tobacco. She reports current alcohol use. She reports that she does not use drugs.  Outpatient Medications Prior to Visit  Medication Sig Dispense Refill  . Cholecalciferol (BIO-D-MULSION PO) Take 5 drops by mouth daily.     . Difluprednate (DUREZOL) 0.05 % EMUL Apply 2 drops to eye 3 (three) times daily as needed.     . famotidine (PEPCID) 20 MG tablet Take 1 tablet (20 mg total) by mouth daily. 30 tablet 1  . sucralfate (CARAFATE) 1 g tablet Take 1 tablet (1 g total) by mouth 4 (four) times daily for 7 days. 28 tablet 0   No facility-administered medications prior to visit.    Review of Systems  Objective:  BP 128/72 (BP Location: Left Arm, Patient Position: Sitting, Cuff Size: Normal)   Pulse 75   Temp 98.6 F (37 C) (Oral)   Resp 16   Ht $R'5\' 5"'uC$  (1.651 m)   Wt 209 lb 8 oz (95 kg)   LMP 09/18/2015   SpO2 97%   BMI 34.86 kg/m   Physical Exam    Assessment & Plan:   Problem List Items Addressed This Visit      Unprioritized   RESOLVED: Advice given about COVID-19 virus infection   Atypical hyperplasia of breast    By surgical biopsy in 2015,  With strong FH of breast CA.  Lifetime risk of CA using GAIL model is 21% per surgical onc evaluation at [facility] and She received  Tamoxifen for five years,  From  August 2015 to 2020.  And is  receiving annual follow up at the high risk breast clinic at [facility] every january      Encounter for preventive health examination    age appropriate education and counseling updated, referrals for preventative services and immunizations addressed, dietary and smoking counseling addressed, most recent labs reviewed.  I have personally reviewed and have noted:  1) the patient's medical and social history 2) The pt's use of alcohol, tobacco, and illicit drugs 3) The patient's current medications and supplements 4) Functional ability including ADL's, fall risk, home safety risk, hearing and visual impairment 5)  Diet and physical activities 6) Evidence for depression or mood disorder 7) The patient's height, weight, and BMI have been recorded in the chart  I have made referrals, and provided counseling and education based on review of the above      Obesity (BMI 30-39.9)    She has gained weight over the past year.  I have addressed  BMI and recommended a low glycemic index diet utilizing smaller more frequent meals to increase metabolism.  I have also recommended that patient start exercising with a goal of 30 minutes of aerobic exercise a minimum of 5 days per week. She has started  Walking daily with a colleague       Serrated adenoma of colon    By Dec 2019 colonoscopy.  3 year follow up is advised by GI         I have discontinued Matt Holmes. Brenneman's sucralfate. I am also having her maintain her Difluprednate, Cholecalciferol (BIO-D-MULSION PO), and famotidine.  No orders of the defined types were placed in this encounter.   Medications Discontinued During This Encounter  Medication Reason  . sucralfate (CARAFATE)  1 g tablet     Follow-up: No follow-ups on file.   Crecencio Mc, MD

## 2020-10-04 ENCOUNTER — Encounter: Payer: Self-pay | Admitting: Internal Medicine

## 2020-10-04 DIAGNOSIS — D126 Benign neoplasm of colon, unspecified: Secondary | ICD-10-CM | POA: Insufficient documentation

## 2020-10-04 DIAGNOSIS — Z7189 Other specified counseling: Secondary | ICD-10-CM | POA: Insufficient documentation

## 2020-10-04 NOTE — Assessment & Plan Note (Signed)
By Dec 2019 colonoscopy.  3 year follow up is advised by GI

## 2020-10-04 NOTE — Assessment & Plan Note (Signed)
By surgical biopsy in 2015,  With strong FH of breast CA.  Lifetime risk of CA using GAIL model is 21% per surgical onc evaluation at [facility] and She received  Tamoxifen for five years,  From  August 2015 to 2020.  And is  receiving annual follow up at the high risk breast clinic at [facility] every january

## 2020-10-04 NOTE — Assessment & Plan Note (Addendum)
She has gained weight over the past year.  I have addressed  BMI and recommended a low glycemic index diet utilizing smaller more frequent meals to increase metabolism.  I have also recommended that patient start exercising with a goal of 30 minutes of aerobic exercise a minimum of 5 days per week. She has started  Walking daily with a colleague

## 2020-10-04 NOTE — Assessment & Plan Note (Signed)

## 2020-10-14 DIAGNOSIS — L570 Actinic keratosis: Secondary | ICD-10-CM | POA: Diagnosis not present

## 2020-10-14 DIAGNOSIS — Z86018 Personal history of other benign neoplasm: Secondary | ICD-10-CM | POA: Diagnosis not present

## 2020-10-14 DIAGNOSIS — L578 Other skin changes due to chronic exposure to nonionizing radiation: Secondary | ICD-10-CM | POA: Diagnosis not present

## 2021-07-27 ENCOUNTER — Other Ambulatory Visit: Payer: Self-pay

## 2021-07-27 ENCOUNTER — Other Ambulatory Visit (INDEPENDENT_AMBULATORY_CARE_PROVIDER_SITE_OTHER): Payer: BC Managed Care – PPO

## 2021-07-27 ENCOUNTER — Telehealth: Payer: Self-pay | Admitting: Internal Medicine

## 2021-07-27 DIAGNOSIS — R300 Dysuria: Secondary | ICD-10-CM | POA: Diagnosis not present

## 2021-07-27 NOTE — Telephone Encounter (Signed)
Is it okay to order labs and schedule a 15 min appt later in the week?

## 2021-07-27 NOTE — Telephone Encounter (Signed)
Lab orders placed.  

## 2021-07-27 NOTE — Telephone Encounter (Signed)
Spoke with pt and she is scheduled for a lab this afternoon and virtual visit with Dr. Derrel Nip on Thursday.

## 2021-07-27 NOTE — Telephone Encounter (Signed)
Patient states she is starting a UTI and would like to bring a urine sample in.

## 2021-07-27 NOTE — Addendum Note (Signed)
Addended by: Adair Laundry on: 07/27/2021 12:50 PM   Modules accepted: Orders

## 2021-07-28 ENCOUNTER — Encounter: Payer: Self-pay | Admitting: Internal Medicine

## 2021-07-28 LAB — URINE CULTURE
MICRO NUMBER:: 12714390
Result:: NO GROWTH
SPECIMEN QUALITY:: ADEQUATE

## 2021-07-28 LAB — URINALYSIS, ROUTINE W REFLEX MICROSCOPIC
Bilirubin Urine: NEGATIVE
Ketones, ur: NEGATIVE
Nitrite: NEGATIVE
Specific Gravity, Urine: 1.01 (ref 1.000–1.030)
Total Protein, Urine: NEGATIVE
Urine Glucose: NEGATIVE
Urobilinogen, UA: 0.2 (ref 0.0–1.0)
pH: 7 (ref 5.0–8.0)

## 2021-07-30 ENCOUNTER — Encounter: Payer: Self-pay | Admitting: Internal Medicine

## 2021-07-30 ENCOUNTER — Telehealth (INDEPENDENT_AMBULATORY_CARE_PROVIDER_SITE_OTHER): Payer: BC Managed Care – PPO | Admitting: Internal Medicine

## 2021-07-30 DIAGNOSIS — R300 Dysuria: Secondary | ICD-10-CM | POA: Diagnosis not present

## 2021-07-30 NOTE — Progress Notes (Signed)
Virtual Visit via [place] Note  This visit type was conducted due to national recommendations for restrictions regarding the COVID-19 pandemic (e.g. social distancing).  This format is felt to be most appropriate for this patient at this time.  All issues noted in this document were discussed and addressed.  No physical exam was performed (except for noted visual exam findings with Video Visits).   I connected withNAME@ on 07/30/21 at  2:15 PM EST by a video enabled telemedicine application or telephone and verified that I am speaking with the correct person using two identifiers. Location patient: home Location provider: work or home office Persons participating in the virtual visit: patient, provider  I discussed the limitations, risks, security and privacy concerns of performing an evaluation and management service by telephone and the availability of in person appointments. I also discussed with the patient that there may be a patient responsible charge related to this service. The patient expressed understanding and agreed to proceed.  Reason for visit:  evaluation of dysuria   HPI:  60 yr old female  history of tamoxifen use x 5 yrs for atypical breast biopsy presents for follow up on dysuria that started 4 days ago.  No recent instrumentation, intercourse ,swimming or hot tub exposure,  but tried a new scented body wash which she has stopped using in the last 48 hours.  Also used Azo.. symptoms resolved    ROS: See pertinent positives and negatives per HPI.  Past Medical History:  Diagnosis Date   History of nephrolithiasis    Mass of perineum, female 03/24/2018    Past Surgical History:  Procedure Laterality Date   BREAST SURGERY Right 08/09/2013   Open biopsy for microcalcifications identification of atypical ductal hyperplasia. Stereotactic biopsy clip not removed during the procedure. Patient declined further surgical intervention. Follow-up at [facility].Marland Kitchen   CESAREAN SECTION      2   CHOLECYSTECTOMY  2007   COLONOSCOPY WITH PROPOFOL N/A 08/07/2018   Procedure: COLONOSCOPY WITH PROPOFOL;  Surgeon: Lin Landsman, MD;  Location: [facility] ENDOSCOPY;  Service: Gastroenterology;  Laterality: N/A;   LIPOMA EXCISION  2019   labia   TONSILLECTOMY      Family History  Problem Relation Age of Onset   Hypertension Father    Pulmonary fibrosis Father    Diabetes Brother    Breast cancer Paternal Grandmother    Lung cancer Paternal Aunt    Breast cancer Maternal Aunt     SOCIAL HX:  reports that she has never smoked. She has never used smokeless tobacco. She reports current alcohol use. She reports that she does not use drugs.    Current Outpatient Medications:    Cholecalciferol (BIO-D-MULSION PO), Take 5 drops by mouth daily. , Disp: , Rfl:    Difluprednate 0.05 % EMUL, Apply 2 drops to eye 3 (three) times daily as needed. , Disp: , Rfl:   EXAM:  VITALS per patient if applicable:  GENERAL: alert, oriented, appears well and in no acute distress  HEENT: atraumatic, conjunttiva clear, no obvious abnormalities on inspection of external nose and ears  NECK: normal movements of the head and neck  LUNGS: on inspection no signs of respiratory distress, breathing rate appears normal, no obvious gross SOB, gasping or wheezing  CV: no obvious cyanosis  MS: moves all visible extremities without noticeable abnormality  PSYCH/NEURO: pleasant and cooperative, no obvious depression or anxiety, speech and thought processing grossly intact  ASSESSMENT AND PLAN:  Discussed the following assessment and plan:  Dysuria  Dysuria UA was notable for pyuria (scant)  With a negative culture and symptoms of dysuria and bladder "heaviness"  that began on Monday have resolved with use of Azo . Vaginal estrogen offered but declined due to history of breast biopsy positive for atypical cells,  Treated with tamoxifen x 5 years. .  Standing order for UA and culture ordered     I  discussed the assessment and treatment plan with the patient. The patient was provided an opportunity to ask questions and all were answered. The patient agreed with the plan and demonstrated an understanding of the instructions.   The patient was advised to call back or seek an in-person evaluation if the symptoms worsen or if the condition fails to improve as anticipated.   I spent 20 minutes dedicated to the care of this patient on the date of this encounter to include pre-visit review of his medical history,  Face-to-face time with the patient , and post visit ordering of testing and therapeutics.    Crecencio Mc, MD

## 2021-07-30 NOTE — Assessment & Plan Note (Signed)
UA was notable for pyuria (scant)  With a negative culture and symptoms of dysuria and bladder "heaviness"  that began on Monday have resolved with use of Azo . Vaginal estrogen offered but declined due to history of breast biopsy positive for atypical cells,  Treated with tamoxifen x 5 years. .  Standing order for UA and culture ordered

## 2021-09-22 DIAGNOSIS — N6091 Unspecified benign mammary dysplasia of right breast: Secondary | ICD-10-CM | POA: Diagnosis not present

## 2021-09-22 DIAGNOSIS — N6099 Unspecified benign mammary dysplasia of unspecified breast: Secondary | ICD-10-CM | POA: Diagnosis not present

## 2021-09-22 DIAGNOSIS — Z1231 Encounter for screening mammogram for malignant neoplasm of breast: Secondary | ICD-10-CM | POA: Diagnosis not present

## 2021-09-22 DIAGNOSIS — Z6836 Body mass index (BMI) 36.0-36.9, adult: Secondary | ICD-10-CM | POA: Diagnosis not present

## 2021-09-22 DIAGNOSIS — Z803 Family history of malignant neoplasm of breast: Secondary | ICD-10-CM | POA: Diagnosis not present

## 2021-09-22 DIAGNOSIS — Z006 Encounter for examination for normal comparison and control in clinical research program: Secondary | ICD-10-CM | POA: Diagnosis not present

## 2021-10-02 ENCOUNTER — Encounter: Payer: BC Managed Care – PPO | Admitting: Internal Medicine

## 2021-10-06 ENCOUNTER — Ambulatory Visit (INDEPENDENT_AMBULATORY_CARE_PROVIDER_SITE_OTHER): Payer: BC Managed Care – PPO | Admitting: Internal Medicine

## 2021-10-06 ENCOUNTER — Other Ambulatory Visit: Payer: Self-pay

## 2021-10-06 ENCOUNTER — Encounter: Payer: Self-pay | Admitting: Internal Medicine

## 2021-10-06 VITALS — BP 124/78 | HR 58 | Temp 98.0°F | Ht 65.0 in | Wt 217.0 lb

## 2021-10-06 DIAGNOSIS — E782 Mixed hyperlipidemia: Secondary | ICD-10-CM

## 2021-10-06 DIAGNOSIS — Z86018 Personal history of other benign neoplasm: Secondary | ICD-10-CM | POA: Diagnosis not present

## 2021-10-06 DIAGNOSIS — Z0001 Encounter for general adult medical examination with abnormal findings: Secondary | ICD-10-CM | POA: Diagnosis not present

## 2021-10-06 DIAGNOSIS — N6099 Unspecified benign mammary dysplasia of unspecified breast: Secondary | ICD-10-CM | POA: Diagnosis not present

## 2021-10-06 DIAGNOSIS — R7301 Impaired fasting glucose: Secondary | ICD-10-CM | POA: Diagnosis not present

## 2021-10-06 DIAGNOSIS — L570 Actinic keratosis: Secondary | ICD-10-CM | POA: Diagnosis not present

## 2021-10-06 DIAGNOSIS — J0110 Acute frontal sinusitis, unspecified: Secondary | ICD-10-CM | POA: Diagnosis not present

## 2021-10-06 DIAGNOSIS — D126 Benign neoplasm of colon, unspecified: Secondary | ICD-10-CM | POA: Diagnosis not present

## 2021-10-06 DIAGNOSIS — E669 Obesity, unspecified: Secondary | ICD-10-CM

## 2021-10-06 DIAGNOSIS — L578 Other skin changes due to chronic exposure to nonionizing radiation: Secondary | ICD-10-CM | POA: Diagnosis not present

## 2021-10-06 DIAGNOSIS — Z0000 Encounter for general adult medical examination without abnormal findings: Secondary | ICD-10-CM

## 2021-10-06 DIAGNOSIS — Z872 Personal history of diseases of the skin and subcutaneous tissue: Secondary | ICD-10-CM | POA: Diagnosis not present

## 2021-10-06 MED ORDER — LEVOFLOXACIN 500 MG PO TABS: 500.0000 mg | ORAL_TABLET | Freq: Every day | ORAL | 0 refills | Status: DC

## 2021-10-06 MED ORDER — PREDNISONE 10 MG PO TABS: ORAL_TABLET | ORAL | 0 refills | Status: DC

## 2021-10-06 NOTE — Assessment & Plan Note (Signed)

## 2021-10-06 NOTE — Assessment & Plan Note (Signed)
Given chronicity of symptoms, development of facial pain and exam consistent with early otitis on the right   Will treat with empiric antibiotics, steroid taper

## 2021-10-06 NOTE — Assessment & Plan Note (Signed)
Referral to dr Marius Ditch in process for 3 yr follow up colonoscopy

## 2021-10-06 NOTE — Assessment & Plan Note (Signed)
Managed with Red yeast rice for 10 yr risk calculated at 8% in 2021.  Repeat lipids have been ordered

## 2021-10-06 NOTE — Patient Instructions (Signed)
°  I am treating you for sinusitis/otitis which is a complication from your viral infection due to  persistent sinus congestion.   I am prescribing an antibiotic (levaquin) and a prednisone taper  To manage the infection and the inflammation in your ear/sinuses.    Please take a probiotic ( Align, Floraque or Culturelle), the generic version of one of these over the counter medications,  for a minimum of 3 weeks to prevent a serious antibiotic associated diarrhea  Called clostridium dificile colitis.  Taking a probiotic may also prevent vaginitis due to yeast infections and can be continued indefinitely if you feel that it improves your digestion or your elimination (bowels).

## 2021-10-06 NOTE — Progress Notes (Addendum)
The patient is here for annual preventive examination and management of other chronic and acute problems.   The risk factors are reflected in the social history.   The roster of all physicians providing medical care to patient - is listed in the Snapshot section of the chart.   Activities of daily living:  The patient is 100% independent in all ADLs: dressing, toileting, feeding as well as independent mobility   Home safety : The patient has smoke detectors in the home. They wear seatbelts.  There are no unsecured firearms at home. There is no violence in the home.    There is no risks for hepatitis, STDs or HIV. There is no   history of blood transfusion. They have no travel history to infectious disease endemic areas of the world.   The patient has seen their dentist in the last six month. They have seen their eye doctor in the last year. The patinet  denies slight hearing difficulty with regard to whispered voices and some television programs.  They have deferred audiologic testing in the last year.  They do not  have excessive sun exposure. Discussed the need for sun protection: hats, long sleeves and use of sunscreen if there is significant sun exposure.    Diet: the importance of a healthy diet is discussed. They do have a healthy diet.   The benefits of regular aerobic exercise were discussed. The patient  exercises  3 to 5 days per week  for  60 minutes.    Depression screen: there are no signs or vegative symptoms of depression- irritability, change in appetite, anhedonia, sadness/tearfullness.   The following portions of the patient's history were reviewed and updated as appropriate: allergies, current medications, past family history, past medical history,  past surgical history, past social history  and problem list.   Visual acuity was not assessed per patient preference since the patient has regular follow up with an  ophthalmologist. Hearing and body mass index were assessed and  reviewed.    During the course of the visit the patient was educated and counseled about appropriate screening and preventive services including : fall prevention , diabetes screening, nutrition counseling, colorectal cancer screening, and recommended immunizations.    Chief Complaint:  HPI     Annual Exam    Additional comments: F/u annual exam      Last edited by Zacarias Pontes, CMA on 10/06/2021  4:46 PM.      1) Atypical ductal hyperplasia:    managed annually by [facility]   2) upper back pain:  began this morning after rolling over in bed.  Had a massage yesterday,  vigorous,   had chiropractic manipulation and laser today by her brother Dr Micheline Chapman in [place]   3) uveitis flare   4) sinus congestion for the past 2 weeks.   HOME COVID TEST NEGATIVE.  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 Son is in [place] now in [place]   Review of Symptoms  Patient denies headache, fevers, malaise, unintentional weight loss, skin rash, eye pain, sinus congestion and sinus pain, sore throat, dysphagia,  hemoptysis , cough, dyspnea, wheezing, chest pain, palpitations, orthopnea, edema, abdominal pain, nausea, melena, diarrhea, constipation, flank pain, dysuria, hematuria, urinary  Frequency, nocturia, numbness, tingling, seizures,  Focal weakness, Loss of consciousness,  Tremor, insomnia, depression,  anxiety, and suicidal ideation.    Physical Exam:  BP 124/78 (BP Location: Left Arm, Patient Position: Sitting, Cuff Size: Large)    Pulse (!) 58    Temp 98 F (36.7 C)    Ht 5\' 5"  (1.651 m)    Wt 217 lb (98.4 kg)    LMP 09/18/2015    SpO2 97%    BMI 36.11 kg/m    General appearance: alert, cooperative and appears stated age Ears: right eardrum slightly bulging,  bright.  Left TM normal  Sinuses:  frontal sinus tenderness  Throat: lips, mucosa, and tongue normal; teeth and  gums normal Neck: no adenopathy, no carotid bruit, supple, symmetrical, trachea midline and thyroid not enlarged, symmetric, no tenderness/mass/nodules Back: symmetric, no curvature. ROM normal. No CVA tenderness. Lungs: clear to auscultation bilaterally Heart: regular rate and rhythm, S1, S2 normal, no murmur, click, rub or gallop Abdomen: soft, non-tender; bowel sounds normal; no masses,  no organomegaly Pulses: 2+ and symmetric Skin: Skin color, texture, turgor normal. No rashes or lesions Lymph nodes: Cervical, supraclavicular, and axillary nodes normal.   Assessment and Plan:  Atypical hyperplasia of breast Followed by [facility] Lilyan Punt , last seen in Jan 2023   Serrated adenoma of colon Referral to dr Marius Ditch in process for 3 yr follow up colonoscopy  Sinusitis, acute frontal Given chronicity of symptoms, development of facial pain and exam consistent with early otitis on the right   Will treat with empiric antibiotics, steroid taper    Obesity (BMI 30-39.9) She has reached a point of motivation and has enrolled in a 4 week weight loss program for women over 50.  The program eliminates all refined sugars and uses 2 smoothies daily .  She will return for  Fasting labs in 2 weeks   Encounter for preventive health examination age appropriate education and counseling updated, referrals for preventative services and immunizations addressed, dietary and smoking counseling addressed, most recent labs reviewed.  I have personally reviewed and have noted:   1) the patient's medical and social history 2) The pt's use of alcohol, tobacco, and illicit drugs 3) The patient's current medications and supplements 4) Functional ability including ADL's, fall risk, home safety risk, hearing and visual impairment 5) Diet and physical activities 6) Evidence for depression or mood disorder 7) The patient's height, weight, and BMI have been recorded in the chart 8) I have  ordered and reviewed a 12 lead EKG and find that there are no acute changes and patient is in sinus rhythm.     I have made referrals, and provided counseling and education based on review of the above  Hyperlipidemia Managed with Red yeast rice for 10 yr risk calculated at 8% in 2021.  Repeat lipids have been ordered  Updated Medication List Outpatient Encounter Medications as of 10/06/2021  Medication Sig   Ascorbic Acid (VITAMIN C ER PO) Take by mouth.   levofloxacin (LEVAQUIN) 500 MG tablet Take 1 tablet (500 mg total) by mouth daily.   Multiple Vitamins-Minerals (ZINC PO) Take by mouth.   predniSONE (DELTASONE) 10 MG tablet 6 tablets on Day 1 , then reduce by 1 tablet daily until gone   Red Yeast Rice Extract (RED YEAST RICE PO) Take by mouth.   Cholecalciferol (BIO-D-MULSION PO) Take 5 drops by mouth daily.    Difluprednate 0.05 % EMUL Apply 2 drops to eye 3 (three) times daily as needed.    No facility-administered encounter medications on file as of  10/06/2021.  °  °

## 2021-10-06 NOTE — Assessment & Plan Note (Signed)
She has reached a point of motivation and has enrolled in a 4 week weight loss program for women over 50.  The program eliminates all refined sugars and uses 2 smoothies daily .  She will return for  Fasting labs in 2 weeks

## 2021-10-06 NOTE — Assessment & Plan Note (Addendum)
Followed by [facility] Lilyan Punt , last seen in Jan 2023

## 2021-10-12 ENCOUNTER — Telehealth: Payer: Self-pay

## 2021-10-12 NOTE — Telephone Encounter (Signed)
CALLED PATIENT NO ANSWER LEFT VOICEMAIL FOR A CALL BACK ? ?

## 2021-10-13 ENCOUNTER — Other Ambulatory Visit: Payer: Self-pay

## 2021-10-13 DIAGNOSIS — D126 Benign neoplasm of colon, unspecified: Secondary | ICD-10-CM

## 2021-10-13 MED ORDER — PEG 3350-KCL-NA BICARB-NACL 420 G PO SOLR: 4000.0000 mL | Freq: Once | ORAL | 0 refills | Status: AC

## 2021-10-13 MED ORDER — SUTAB 1479-225-188 MG PO TABS: 12.0000 | ORAL_TABLET | Freq: Once | ORAL | 0 refills | Status: AC

## 2021-10-13 NOTE — Progress Notes (Signed)
Gastroenterology Pre-Procedure Review  Request Date: 11/16/2021 Requesting Physician: Dr. Marius Ditch  PATIENT REVIEW QUESTIONS: The patient responded to the following health history questions as indicated:    1. Are you having any GI issues? no 2. Do you have a personal history of Polyps? yes (LAST COLONOSCOPY) 3. Do you have a family history of Colon Cancer or Polyps? no 4. Diabetes Mellitus? no 5. Joint replacements in the past 12 months?no 6. Major health problems in the past 3 months?no 7. Any artificial heart valves, MVP, or defibrillator?no    MEDICATIONS & ALLERGIES:    Patient reports the following regarding taking any anticoagulation/antiplatelet therapy:   Plavix, Coumadin, Eliquis, Xarelto, Lovenox, Pradaxa, Brilinta, or Effient? no Aspirin? no  Patient confirms/reports the following medications:  Current Outpatient Medications  Medication Sig Dispense Refill   Ascorbic Acid (VITAMIN C ER PO) Take by mouth.     Cholecalciferol (BIO-D-MULSION PO) Take 5 drops by mouth daily.      Difluprednate 0.05 % EMUL Apply 2 drops to eye 3 (three) times daily as needed.      levofloxacin (LEVAQUIN) 500 MG tablet Take 1 tablet (500 mg total) by mouth daily. 7 tablet 0   Multiple Vitamins-Minerals (ZINC PO) Take by mouth.     predniSONE (DELTASONE) 10 MG tablet 6 tablets on Day 1 , then reduce by 1 tablet daily until gone 21 tablet 0   Red Yeast Rice Extract (RED YEAST RICE PO) Take by mouth.     No current facility-administered medications for this visit.    Patient confirms/reports the following allergies:  Allergies  Allergen Reactions   Penicillins Rash    No orders of the defined types were placed in this encounter.   AUTHORIZATION INFORMATION Primary Insurance: 1D#: Group #:  Secondary Insurance: 1D#: Group #:  SCHEDULE INFORMATION: Date: 11/16/2021 Time: Location:[facility]

## 2021-10-22 DIAGNOSIS — H43811 Vitreous degeneration, right eye: Secondary | ICD-10-CM | POA: Diagnosis not present

## 2021-11-13 ENCOUNTER — Telehealth: Payer: Self-pay | Admitting: Gastroenterology

## 2021-11-13 NOTE — Telephone Encounter (Signed)
Pt left message to cancel colonoscpy because she is unable to take off work but will call back to resh ?

## 2021-11-13 NOTE — Telephone Encounter (Signed)
Patient left vm to cancel colonoscopy on 11/16/2021. Patient states she got called out of town for work. Requesting a call back that we received her message to cancel and reschedule. ?

## 2021-11-16 ENCOUNTER — Telehealth: Payer: Self-pay

## 2021-11-16 ENCOUNTER — Encounter: Admission: RE | Payer: Self-pay | Source: Ambulatory Visit

## 2021-11-16 ENCOUNTER — Ambulatory Visit
Admission: RE | Admit: 2021-11-16 | Payer: BC Managed Care – PPO | Source: Ambulatory Visit | Admitting: Gastroenterology

## 2021-11-16 SURGERY — COLONOSCOPY WITH PROPOFOL
Anesthesia: General

## 2021-11-16 NOTE — Telephone Encounter (Signed)
Called to cancel patients procedure it was already canceled ?

## 2021-11-18 DIAGNOSIS — H43811 Vitreous degeneration, right eye: Secondary | ICD-10-CM | POA: Diagnosis not present

## 2021-12-01 ENCOUNTER — Telehealth: Payer: Self-pay | Admitting: Gastroenterology

## 2021-12-01 NOTE — Telephone Encounter (Signed)
Pt called to resche. Cancelled procedure ?

## 2022-03-23 ENCOUNTER — Telehealth: Payer: Self-pay | Admitting: Gastroenterology

## 2022-03-23 ENCOUNTER — Other Ambulatory Visit: Payer: Self-pay

## 2022-03-23 ENCOUNTER — Telehealth: Payer: Self-pay

## 2022-03-23 DIAGNOSIS — D126 Benign neoplasm of colon, unspecified: Secondary | ICD-10-CM

## 2022-03-23 NOTE — Telephone Encounter (Signed)
Phone call returned to reschedule canceled colonoscopy with Dr. Marius Ditch from 11/16/21.  Pt has been rescheduled to [facility] on 05/04/22. She stated she already has her bowel prep from last time.  Thanks,  [place], [place]

## 2022-03-23 NOTE — Telephone Encounter (Signed)
Patient calling to schedule colonoscopy. Patient states she has been trying to schedule this since March. Please return patients call.

## 2022-04-28 ENCOUNTER — Encounter: Payer: Self-pay | Admitting: Gastroenterology

## 2022-05-04 ENCOUNTER — Encounter: Payer: Self-pay | Admitting: Gastroenterology

## 2022-05-04 ENCOUNTER — Other Ambulatory Visit: Payer: Self-pay

## 2022-05-04 ENCOUNTER — Encounter
Admission: RE | Disposition: A | Discharge status: Home / Self Care | Payer: Self-pay | Source: Home / Self Care | Attending: Gastroenterology

## 2022-05-04 ENCOUNTER — Ambulatory Visit
Admission: RE | Admit: 2022-05-04 | Discharge: 2022-05-04 | Disposition: A | Discharge status: Home / Self Care | Payer: BC Managed Care – PPO | Attending: Gastroenterology | Admitting: Gastroenterology

## 2022-05-04 ENCOUNTER — Ambulatory Visit: Payer: BC Managed Care – PPO | Admitting: Anesthesiology

## 2022-05-04 DIAGNOSIS — Z860100 Personal history of colon polyps, unspecified: Secondary | ICD-10-CM

## 2022-05-04 DIAGNOSIS — Z1211 Encounter for screening for malignant neoplasm of colon: Secondary | ICD-10-CM | POA: Insufficient documentation

## 2022-05-04 DIAGNOSIS — D126 Benign neoplasm of colon, unspecified: Secondary | ICD-10-CM

## 2022-05-04 HISTORY — PX: COLONOSCOPY WITH PROPOFOL: SHX5780

## 2022-05-04 HISTORY — DX: Family history of other specified conditions: Z84.89

## 2022-05-04 SURGERY — COLONOSCOPY WITH PROPOFOL
Anesthesia: General | Site: Rectum

## 2022-05-04 MED ORDER — STERILE WATER FOR IRRIGATION IR SOLN
Status: DC | PRN
  Administered 2022-05-04: 50 mL

## 2022-05-04 MED ORDER — LACTATED RINGERS IV SOLN: INTRAVENOUS | Status: DC

## 2022-05-04 MED ORDER — SODIUM CHLORIDE 0.9 % IV SOLN: INTRAVENOUS | Status: DC

## 2022-05-04 MED ORDER — PROPOFOL 10 MG/ML IV BOLUS
INTRAVENOUS | Status: DC | PRN
  Administered 2022-05-04 (×8): 50 mg via INTRAVENOUS

## 2022-05-04 SURGICAL SUPPLY — 6 items
GOWN CVR UNV OPN BCK APRN NK (MISCELLANEOUS) ×2 IMPLANT
GOWN ISOL THUMB LOOP REG UNIV (MISCELLANEOUS) ×2
KIT PRC NS LF DISP ENDO (KITS) ×1 IMPLANT
KIT PROCEDURE OLYMPUS (KITS) ×1
MANIFOLD NEPTUNE II (INSTRUMENTS) ×1 IMPLANT
WATER STERILE IRR 250ML POUR (IV SOLUTION) ×1 IMPLANT

## 2022-05-04 NOTE — H&P (Signed)
Cephas Darby, MD [address]  [place]  [place], [place] [address]  Main: [phone]  Fax: [phone] Pager: [phone]  Primary Care Physician:  Crecencio Mc, MD Primary Gastroenterologist:  Dr. Cephas Darby  Pre-Procedure History & Physical: HPI:  Amy Oliver is a 61 y.o. female is here for an colonoscopy.   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 Stereotactic biopsy clip not removed during the procedure. Patient declined further surgical intervention. Follow-up at [facility].Marland Kitchen   CESAREAN SECTION     2   CHOLECYSTECTOMY  2007   COLONOSCOPY WITH PROPOFOL N/A 08/07/2018   Procedure: COLONOSCOPY WITH PROPOFOL;  Surgeon: Lin Landsman, MD;  Location: [facility] ENDOSCOPY;  Service: Gastroenterology;  Laterality: N/A;   LIPOMA EXCISION  2019   labia   TONSILLECTOMY      Prior to Admission medications   Medication Sig Start Date End Date Taking? Authorizing Provider  Ascorbic Acid (VITAMIN C ER PO) Take by mouth.   Yes [provider]  Cholecalciferol (BIO-D-MULSION PO) Take 5 drops by mouth daily.    Yes [provider]  Multiple Vitamins-Minerals (ZINC PO) Take by mouth.   Yes [provider]  Red Yeast Rice Extract (RED YEAST RICE PO) Take by mouth.   Yes [provider]  Difluprednate 0.05 % EMUL Apply 2 drops to eye 3 (three) times daily as needed.     [provider]  levofloxacin (LEVAQUIN) 500 MG tablet Take 1 tablet (500 mg total) by mouth daily. Patient not taking: Reported on 04/28/2022 10/06/21   Crecencio Mc, MD    Allergies as of 03/23/2022 - Review  Complete 10/13/2021  Allergen Reaction Noted   Penicillins Rash 01/11/2012    Family History  Problem Relation Age of Onset   Hypertension Father    Pulmonary fibrosis Father    Diabetes Brother    Breast cancer Paternal Grandmother    Lung cancer Paternal Aunt    Breast cancer Maternal Aunt     Social History   Socioeconomic History   Marital status: Married    Spouse name: Not on file   Number of children: Not on file   Years of education: Not on file   Highest education level: Not on file  Occupational History   Occupation: Lexicographer: [employer]    Comment: non Facilities manager  Tobacco Use   Smoking status: Never   Smokeless tobacco: Never  Vaping Use   Vaping Use: Never used  Substance and Sexual Activity   Alcohol use: Yes    Comment: rare   Drug use: No   Sexual activity: Not on file  Other Topics Concern   Not on file  Social History Narrative   Not on file   Social Determinants of Health   Financial Resource Strain: Not on file  Food Insecurity: Not on file  Transportation Needs: Not on file  Physical Activity: Not on file  Stress: Not on file  Social Connections: Not on file  Intimate Partner Violence: Not on file    Review of Systems: See HPI,  otherwise negative ROS  Physical Exam: BP (!) 142/75   Pulse 81   Temp 98.4 F (36.9 C) (Temporal)   Resp 20   Ht '5\' 5"'$  (1.651 m)   Wt 96.2 kg   LMP 09/18/2015   SpO2 98%   BMI 35.28 kg/m  General:   Alert,  pleasant and cooperative in NAD Head:  Normocephalic and atraumatic. Neck:  Supple; no masses or thyromegaly. Lungs:  Clear throughout to auscultation.    Heart:  Regular rate and rhythm. Abdomen:  Soft, nontender and nondistended. Normal bowel sounds, without guarding, and without rebound.   Neurologic:  Alert and  oriented x4;  grossly normal neurologically.  Impression/Plan: Amy Oliver is here for an colonoscopy to be performed for h/o colon  adneoma  Risks, benefits, limitations, and alternatives regarding  colonoscopy have been reviewed with the patient.  Questions have been answered.  All parties agreeable.   Sherri Sear, MD  05/04/2022, 10:19 AM

## 2022-05-04 NOTE — Anesthesia Postprocedure Evaluation (Signed)
Anesthesia Post Note  Patient: Amy Oliver  Procedure(s) Performed: COLONOSCOPY WITH PROPOFOL (Rectum)     Patient location during evaluation: PACU Anesthesia Type: General Level of consciousness: awake and alert Pain management: pain level controlled Vital Signs Assessment: post-procedure vital signs reviewed and stable Respiratory status: spontaneous breathing, nonlabored ventilation, respiratory function stable and patient connected to nasal cannula oxygen Cardiovascular status: blood pressure returned to baseline and stable Postop Assessment: no apparent nausea or vomiting Anesthetic complications: no   There were no known notable events for this encounter.  Dimas Millin

## 2022-05-04 NOTE — Op Note (Signed)
[facility] Patient Name: Amy Oliver Procedure Date: 05/04/2022 10:18 AM MRN: [id number] Account #: [id number] Date of Birth: 06/20/1961 Admit Type: Outpatient Age: 61 Room: [facility] OR ROOM 01 Gender: Female Note Status: Finalized Instrument Name: [id number] Procedure:             Colonoscopy Indications:           Surveillance: Personal history of adenomatous polyps                         on last colonoscopy 3 years ago, Last colonoscopy:                         December 2019 Providers:             Lin Landsman MD, MD Referring MD:          Deborra Medina, MD (Referring MD) Medicines:             General Anesthesia Complications:         No immediate complications. Estimated blood loss: None. Procedure:             Pre-Anesthesia Assessment:                        - Prior to the procedure, a History and Physical was                         performed, and patient medications and allergies were                         reviewed. The patient is competent. The risks and                         benefits of the procedure and the sedation options and                         risks were discussed with the patient. All questions                         were answered and informed consent was obtained.                         Patient identification and proposed procedure were                         verified by the physician, the nurse, the                         anesthesiologist, the anesthetist and the technician                         in the pre-procedure area in the procedure room in the                         endoscopy suite. Mental Status Examination: alert and                         oriented. Airway Examination: normal oropharyngeal  airway and neck mobility. Respiratory Examination:                         clear to auscultation. CV Examination: normal.                         Prophylactic Antibiotics: The patient does not  require                         prophylactic antibiotics. Prior Anticoagulants: The                         patient has taken no previous anticoagulant or                         antiplatelet agents. ASA Grade Assessment: II - A                         patient with mild systemic disease. After reviewing                         the risks and benefits, the patient was deemed in                         satisfactory condition to undergo the procedure. The                         anesthesia plan was to use general anesthesia.                         Immediately prior to administration of medications,                         the patient was re-assessed for adequacy to receive                         sedatives. The heart rate, respiratory rate, oxygen                         saturations, blood pressure, adequacy of pulmonary                         ventilation, and response to care were monitored                         throughout the procedure. The physical status of the                         patient was re-assessed after the procedure.                        After obtaining informed consent, the colonoscope was                         passed under direct vision. Throughout the procedure,                         the patient's blood pressure, pulse, and oxygen  saturations were monitored continuously. The                         Colonoscope was introduced through the anus and                         advanced to the the cecum, identified by appendiceal                         orifice and ileocecal valve. The colonoscopy was                         performed with moderate difficulty due to significant                         looping and the patient's body habitus. Successful                         completion of the procedure was aided by applying                         abdominal pressure. The patient tolerated the                         procedure well. The quality of the  bowel preparation                         was evaluated using the BBPS [facility] Bowel Preparation                         Scale) with scores of: Right Colon = 3, Transverse                         Colon = 3 and Left Colon = 3 (entire mucosa seen well                         with no residual staining, small fragments of stool or                         opaque liquid). The total BBPS score equals 9. Findings:      The perianal and digital rectal examinations were normal. Pertinent       negatives include normal sphincter tone and no palpable rectal lesions.      The entire examined colon appeared normal.      The retroflexed view of the distal rectum and anal verge was normal and       showed no anal or rectal abnormalities. Impression:            - The entire examined colon is normal.                        - No specimens collected. Recommendation:        - Discharge patient to home (with escort).                        - Resume previous diet today.                        -  Continue present medications.                        - Repeat colonoscopy in 10 years for screening                         purposes. Procedure Code(s):     --- Professional ---                        L4562, Colorectal cancer screening; colonoscopy on                         individual at high risk Diagnosis Code(s):     --- Professional ---                        Z86.010, Personal history of colonic polyps CPT copyright 2019 American Medical Association. All rights reserved. The codes documented in this report are preliminary and upon coder review may  be revised to meet current compliance requirements. Dr. Ulyess Mort Lin Landsman MD, MD 05/04/2022 10:52:55 AM This report has been signed electronically. Number of Addenda: 0 Note Initiated On: 05/04/2022 10:18 AM Scope Withdrawal Time: 0 hours 11 minutes 6 seconds  Total Procedure Duration: 0 hours 20 minutes 41 seconds  Estimated Blood Loss:  Estimated  blood loss: none. Estimated blood loss: none.      [facility]

## 2022-05-04 NOTE — Transfer of Care (Signed)
Immediate Anesthesia Transfer of Care Note  Patient: Amy Oliver  Procedure(s) Performed: COLONOSCOPY WITH PROPOFOL (Rectum)  Patient Location: PACU  Anesthesia Type: General  Level of Consciousness: awake, alert  and patient cooperative  Airway and Oxygen Therapy: Patient Spontanous Breathing and Patient connected to supplemental oxygen  Post-op Assessment: Post-op Vital signs reviewed, Patient's Cardiovascular Status Stable, Respiratory Function Stable, Patent Airway and No signs of Nausea or vomiting  Post-op Vital Signs: Reviewed and stable  Complications: There were no known notable events for this encounter.

## 2022-05-04 NOTE — Anesthesia Preprocedure Evaluation (Signed)
Anesthesia Evaluation  Patient identified by MRN, date of birth, ID band Patient awake    Reviewed: Allergy & Precautions, H&P , NPO status , Patient's Chart, lab work & pertinent test results  Airway Mallampati: III       Dental  (+) Teeth Intact, Dental Advidsory Given   Pulmonary neg pulmonary ROS, neg COPD,           Cardiovascular (-) angina(-) Past MI and (-) Cardiac Stents negative cardio ROS  (-) dysrhythmias      Neuro/Psych negative neurological ROS  negative psych ROS   GI/Hepatic negative GI ROS, Neg liver ROS,   Endo/Other  negative endocrine ROS  Renal/GU negative Renal ROS  negative genitourinary   Musculoskeletal   Abdominal   Peds  Hematology negative hematology ROS (+)   Anesthesia Other Findings Past Medical History: No date: History of nephrolithiasis  Past Surgical History: 08/09/2013: BREAST SURGERY; Right     Comment:  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 Stereotactic biopsy clip not              removed during the procedure. Patient declined further               surgical intervention. [facility] at [facility] No date: CESAREAN SECTION     Comment:  2 2007: CHOLECYSTECTOMY No date: LIPOMA EXCISION     Comment:  labia No date: TONSILLECTOMY  BMI    Body Mass Index:  35.11 kg/m      Reproductive/Obstetrics negative OB ROS                             Anesthesia Physical  Anesthesia Plan  ASA: II  Anesthesia Plan: General   Post-op Pain Management: Minimal or no pain anticipated   Induction: Intravenous  PONV Risk Score and Plan: 3 and Propofol infusion and TIVA  Airway Management Planned: Nasal Cannula  Additional Equipment: None  Intra-op Plan:   Post-operative Plan:   Informed Consent: I have reviewed the patients History and Physical, chart, labs and discussed the procedure  including the risks, benefits and alternatives for the proposed anesthesia with the patient or authorized representative who has indicated his/her understanding and acceptance.     Dental Advisory Given  Plan Discussed with: Anesthesiologist, CRNA and Surgeon  Anesthesia Plan Comments: (Discussed risks of anesthesia with patient, including possibility of difficulty with spontaneous ventilation under anesthesia necessitating airway intervention, PONV, and rare risks such as cardiac or respiratory or neurological events, and allergic reactions. Discussed the role of CRNA in patient's perioperative care. Patient understands.)        Anesthesia Quick Evaluation

## 2022-05-05 ENCOUNTER — Encounter: Payer: Self-pay | Admitting: Gastroenterology

## 2022-05-06 LAB — VITAMIN D 25 HYDROXY (VIT D DEFICIENCY, FRACTURES): Vit D, 25-Hydroxy: 23.6

## 2022-05-06 LAB — BASIC METABOLIC PANEL
BUN: 9 (ref 4–21)
Chloride: 105 (ref 99–108)
Creatinine: 0.8 (ref 0.5–1.1)
Glucose: 100
Potassium: 4.5 mEq/L (ref 3.5–5.1)
Sodium: 140 (ref 137–147)

## 2022-05-06 LAB — HEPATIC FUNCTION PANEL
ALT: 12 U/L (ref 7–35)
AST: 12 — AB (ref 13–35)
Alkaline Phosphatase: 98 (ref 25–125)
Bilirubin, Total: 0.5

## 2022-05-06 LAB — CBC AND DIFFERENTIAL
HCT: 42 (ref 36–46)
Hemoglobin: 14 (ref 12.0–16.0)
Neutrophils Absolute: 3.8
Platelets: 399 10*3/uL (ref 150–400)
WBC: 6.7

## 2022-05-06 LAB — COMPREHENSIVE METABOLIC PANEL
Albumin: 4.2 (ref 3.5–5.0)
Calcium: 9.6 (ref 8.7–10.7)
Globulin: 2.5
eGFR: 85

## 2022-05-06 LAB — LIPID PANEL
Cholesterol: 279 — AB (ref 0–200)
LDL Cholesterol: 218
Triglycerides: 117 (ref 40–160)

## 2022-05-06 LAB — VITAMIN B12: Vitamin B-12: 248

## 2022-05-06 LAB — TSH: TSH: 2.72 (ref 0.41–5.90)

## 2022-05-06 LAB — HEMOGLOBIN A1C: Hemoglobin A1C: 5.8

## 2022-05-06 LAB — IRON,TIBC AND FERRITIN PANEL: Iron: 81

## 2022-05-06 LAB — CBC: RBC: 4.75 (ref 3.87–5.11)

## 2022-07-26 ENCOUNTER — Other Ambulatory Visit (INDEPENDENT_AMBULATORY_CARE_PROVIDER_SITE_OTHER): Payer: BC Managed Care – PPO

## 2022-07-26 ENCOUNTER — Telehealth: Payer: Self-pay | Admitting: Internal Medicine

## 2022-07-26 DIAGNOSIS — R300 Dysuria: Secondary | ICD-10-CM | POA: Diagnosis not present

## 2022-07-26 NOTE — Telephone Encounter (Signed)
Pt called stating she thinks she has a kidney infection and would like to get blood work done. Pt stated she has a standing order for this. Pt would like to be called

## 2022-07-26 NOTE — Telephone Encounter (Signed)
Spoke with pt and scheduled her for a lab appt today and a virtual visit on Friday for a follow up.

## 2022-07-27 DIAGNOSIS — H2513 Age-related nuclear cataract, bilateral: Secondary | ICD-10-CM | POA: Diagnosis not present

## 2022-07-27 LAB — URINE CULTURE
MICRO NUMBER:: 14265682
SPECIMEN QUALITY:: ADEQUATE

## 2022-07-27 LAB — URINALYSIS, ROUTINE W REFLEX MICROSCOPIC
Bilirubin Urine: NEGATIVE
Ketones, ur: NEGATIVE
Nitrite: NEGATIVE
Specific Gravity, Urine: 1.01 (ref 1.000–1.030)
Total Protein, Urine: NEGATIVE
Urine Glucose: NEGATIVE
Urobilinogen, UA: 0.2 (ref 0.0–1.0)
pH: 6.5 (ref 5.0–8.0)

## 2022-07-30 ENCOUNTER — Ambulatory Visit: Payer: BC Managed Care – PPO | Admitting: Internal Medicine

## 2022-07-30 ENCOUNTER — Encounter: Payer: Self-pay | Admitting: Internal Medicine

## 2022-07-30 ENCOUNTER — Other Ambulatory Visit (HOSPITAL_COMMUNITY)
Admission: RE | Admit: 2022-07-30 | Discharge: 2022-07-30 | Disposition: A | Discharge status: Home / Self Care | Payer: BC Managed Care – PPO | Source: Ambulatory Visit | Attending: Internal Medicine | Admitting: Internal Medicine

## 2022-07-30 VITALS — BP 138/84 | HR 88 | Temp 98.1°F | Ht 65.0 in | Wt 218.8 lb

## 2022-07-30 DIAGNOSIS — Z124 Encounter for screening for malignant neoplasm of cervix: Secondary | ICD-10-CM | POA: Diagnosis not present

## 2022-07-30 DIAGNOSIS — R310 Gross hematuria: Secondary | ICD-10-CM | POA: Diagnosis not present

## 2022-07-30 DIAGNOSIS — R109 Unspecified abdominal pain: Secondary | ICD-10-CM | POA: Diagnosis not present

## 2022-07-30 MED ORDER — HYDROCODONE-ACETAMINOPHEN 10-325 MG PO TABS: 1.0000 | ORAL_TABLET | Freq: Four times a day (QID) | ORAL | 0 refills | Status: AC | PRN

## 2022-07-30 MED ORDER — TAMSULOSIN HCL 0.4 MG PO CAPS: 0.4000 mg | ORAL_CAPSULE | Freq: Every day | ORAL | 3 refills | Status: DC

## 2022-07-30 NOTE — Progress Notes (Unsigned)
Subjective:  Patient ID: Amy Oliver, female    DOB: 02-23-1961  Age: 61 y.o. MRN: [id number]  CC: There were no encounter diagnoses.   HPI Amy Oliver presents for  Chief Complaint  Patient presents with   Follow-up    Follow up on urine results.    Intermittent Bilateral back pain for the last several months.   After a trip to [place] 2 weeks ago , she developed urinary urgency .  On Saturday bilateral CVA pain became persistent,  temporarily relieved with ibuprofen.  No fevers,  no nausea or diarreha.   On Sunday noted blood in urine   She has History of nephrolithiasis once remotely, recalls that the pain was much worse   UA and culture reviewed  no pyuria ,  culture negative .  Hematuria noted   Outpatient Medications Prior to Visit  Medication Sig Dispense Refill   Ascorbic Acid (VITAMIN C ER PO) Take by mouth.     Cholecalciferol (BIO-D-MULSION PO) Take 5 drops by mouth daily.      Difluprednate 0.05 % EMUL Apply 2 drops to eye 3 (three) times daily as needed.      Multiple Vitamins-Minerals (ZINC PO) Take by mouth.     Red Yeast Rice Extract (RED YEAST RICE PO) Take by mouth.     No facility-administered medications prior to visit.    Review of Systems;  Patient denies headache, fevers, malaise, unintentional weight loss, skin rash, eye pain, sinus congestion and sinus pain, sore throat, dysphagia,  hemoptysis , cough, dyspnea, wheezing, chest pain, palpitations, orthopnea, edema, abdominal pain, nausea, melena, diarrhea, constipation, flank pain, dysuria, hematuria, urinary  Frequency, nocturia, numbness, tingling, seizures,  Focal weakness, Loss of consciousness,  Tremor, insomnia, depression, anxiety, and suicidal ideation.      Objective:  BP 138/84   Pulse 88   Temp 98.1 F (36.7 C) (Oral)   Ht '5\' 5"'$  (1.651 m)   Wt 218 lb 12.8 oz (99.2 kg)   LMP 09/18/2015   SpO2 97%   BMI 36.41 kg/m   BP Readings from Last 3 Encounters:  07/30/22 138/84  05/04/22  114/69  10/06/21 124/78    Wt Readings from Last 3 Encounters:  07/30/22 218 lb 12.8 oz (99.2 kg)  05/04/22 212 lb (96.2 kg)  10/06/21 217 lb (98.4 kg)    General appearance: alert, cooperative and appears stated age Ears: normal TM's and external ear canals both ears Throat: lips, mucosa, and tongue normal; teeth and gums normal Neck: no adenopathy, no carotid bruit, supple, symmetrical, trachea midline and thyroid not enlarged, symmetric, no tenderness/mass/nodules Back: symmetric, no curvature. ROM normal. No CVA tenderness. Lungs: clear to auscultation bilaterally Heart: regular rate and rhythm, S1, S2 normal, no murmur, click, rub or gallop Abdomen: soft, non-tender; bowel sounds normal; no masses,  no organomegaly Pulses: 2+ and symmetric Skin: Skin color, texture, turgor normal. No rashes or lesions Lymph nodes: Cervical, supraclavicular, and axillary nodes normal. Neuro:  awake and interactive with normal mood and affect. Higher cortical functions are normal. Speech is clear without word-finding difficulty or dysarthria. Extraocular movements are intact. Visual fields of both eyes are grossly intact. Sensation to light touch is grossly intact bilaterally of upper and lower extremities. Motor examination shows 4+/5 symmetric hand grip and upper extremity and 5/5 lower extremity strength. There is no pronation or drift. Gait is non-ataxic   Lab Results  Component Value Date   HGBA1C 5.8 05/06/2022    Lab Results  Component Value Date   CREATININE 0.8 05/06/2022   CREATININE 0.77 10/31/2019   CREATININE 0.79 10/01/2019    Lab Results  Component Value Date   WBC 6.7 05/06/2022   HGB 14.0 05/06/2022   HCT 42 05/06/2022   PLT 399 05/06/2022   GLUCOSE 117 (H) 10/31/2019   CHOL 279 (A) 05/06/2022   TRIG 117 05/06/2022   HDL 48.70 10/01/2019   LDLDIRECT 226.5 09/28/2013   LDLCALC 218 05/06/2022   ALT 12 05/06/2022   AST 12 (A) 05/06/2022   NA 140 05/06/2022   K 4.5  05/06/2022   CL 105 05/06/2022   CREATININE 0.8 05/06/2022   BUN 9 05/06/2022   CO2 26 10/31/2019   TSH 2.72 05/06/2022   HGBA1C 5.8 05/06/2022    No results found.  Assessment & Plan:   Problem List Items Addressed This Visit   None   I spent a total of   minutes with this patient in a face to face visit on the date of this encounter reviewing the last office visit with me in       ,  most recent visit with cardiology ,    ,  patient's diet and exercise habits, home blood pressure /blod sugar readings, recent ER visit including labs and imaging studies ,   and post visit ordering of testing and therapeutics.    Follow-up: No follow-ups on file.   Crecencio Mc, MD

## 2022-07-30 NOTE — Patient Instructions (Signed)
Your back pain and urinalysis suggest you may have kidney stones  I have ordered a CT scan to be done next week  Take the tamsulosin daily ,, increase fluids to as much as you can tolerate  Vicodin IF NEEDED  for moderate to severe pain

## 2022-08-01 DIAGNOSIS — R319 Hematuria, unspecified: Secondary | ICD-10-CM | POA: Insufficient documentation

## 2022-08-01 NOTE — Assessment & Plan Note (Signed)
She has no evidence of UTI or PMB based on urine studies and today's pelvi  exam.  Will treat for nepholithiasis,   CT orer

## 2022-08-02 ENCOUNTER — Telehealth: Payer: Self-pay | Admitting: Internal Medicine

## 2022-08-02 NOTE — Telephone Encounter (Signed)
Lft pt vm to call ofc . thanks 

## 2022-08-03 LAB — CYTOLOGY - PAP
Comment: NEGATIVE
Diagnosis: NEGATIVE
High risk HPV: NEGATIVE

## 2022-08-10 ENCOUNTER — Ambulatory Visit
Admission: RE | Admit: 2022-08-10 | Discharge: 2022-08-10 | Disposition: A | Discharge status: Home / Self Care | Payer: BC Managed Care – PPO | Source: Ambulatory Visit | Attending: Internal Medicine | Admitting: Internal Medicine

## 2022-08-10 DIAGNOSIS — R109 Unspecified abdominal pain: Secondary | ICD-10-CM | POA: Insufficient documentation

## 2022-08-10 DIAGNOSIS — R310 Gross hematuria: Secondary | ICD-10-CM | POA: Insufficient documentation

## 2022-08-10 DIAGNOSIS — Z9049 Acquired absence of other specified parts of digestive tract: Secondary | ICD-10-CM | POA: Diagnosis not present

## 2022-08-10 DIAGNOSIS — D259 Leiomyoma of uterus, unspecified: Secondary | ICD-10-CM | POA: Diagnosis not present

## 2022-08-10 DIAGNOSIS — N200 Calculus of kidney: Secondary | ICD-10-CM | POA: Insufficient documentation

## 2022-08-10 DIAGNOSIS — R3915 Urgency of urination: Secondary | ICD-10-CM | POA: Diagnosis not present

## 2022-08-11 DIAGNOSIS — N200 Calculus of kidney: Secondary | ICD-10-CM | POA: Insufficient documentation

## 2022-08-11 NOTE — Assessment & Plan Note (Signed)
CT scan did reveal a 1 cm x 0.5 cm stone in the left renal pelvis,  with stranding or hydronephrosis.  Urology referral offered

## 2022-08-24 ENCOUNTER — Encounter: Payer: Self-pay | Admitting: Internal Medicine

## 2022-09-22 DIAGNOSIS — N200 Calculus of kidney: Secondary | ICD-10-CM | POA: Diagnosis not present

## 2022-09-22 DIAGNOSIS — N6091 Unspecified benign mammary dysplasia of right breast: Secondary | ICD-10-CM | POA: Diagnosis not present

## 2022-09-22 DIAGNOSIS — Z006 Encounter for examination for normal comparison and control in clinical research program: Secondary | ICD-10-CM | POA: Diagnosis not present

## 2022-09-22 DIAGNOSIS — N6099 Unspecified benign mammary dysplasia of unspecified breast: Secondary | ICD-10-CM | POA: Diagnosis not present

## 2022-09-22 DIAGNOSIS — Z803 Family history of malignant neoplasm of breast: Secondary | ICD-10-CM | POA: Diagnosis not present

## 2022-09-22 DIAGNOSIS — Z1231 Encounter for screening mammogram for malignant neoplasm of breast: Secondary | ICD-10-CM | POA: Diagnosis not present

## 2022-10-07 DIAGNOSIS — Z86018 Personal history of other benign neoplasm: Secondary | ICD-10-CM | POA: Diagnosis not present

## 2022-10-07 DIAGNOSIS — D2330 Other benign neoplasm of skin of unspecified part of face: Secondary | ICD-10-CM | POA: Diagnosis not present

## 2022-10-07 DIAGNOSIS — L578 Other skin changes due to chronic exposure to nonionizing radiation: Secondary | ICD-10-CM | POA: Diagnosis not present

## 2022-10-07 DIAGNOSIS — Z872 Personal history of diseases of the skin and subcutaneous tissue: Secondary | ICD-10-CM | POA: Diagnosis not present

## 2022-10-18 ENCOUNTER — Telehealth: Payer: Self-pay | Admitting: Internal Medicine

## 2022-10-18 ENCOUNTER — Other Ambulatory Visit (INDEPENDENT_AMBULATORY_CARE_PROVIDER_SITE_OTHER): Payer: BC Managed Care – PPO

## 2022-10-18 ENCOUNTER — Other Ambulatory Visit: Payer: Self-pay

## 2022-10-18 DIAGNOSIS — R300 Dysuria: Secondary | ICD-10-CM

## 2022-10-18 LAB — URINALYSIS, ROUTINE W REFLEX MICROSCOPIC

## 2022-10-18 NOTE — Telephone Encounter (Signed)
Pt called in staying that she believes she has an UTI, and wanted to know if Dr.Tullo can put a urinalysis order in?? Any questions, she's available [phone].

## 2022-10-20 LAB — URINE CULTURE
MICRO NUMBER:: 14614163
SPECIMEN QUALITY:: ADEQUATE

## 2022-10-21 ENCOUNTER — Encounter: Payer: Self-pay | Admitting: Internal Medicine

## 2022-10-21 ENCOUNTER — Telehealth: Payer: BC Managed Care – PPO | Admitting: Internal Medicine

## 2022-10-21 VITALS — Ht 65.0 in | Wt 218.0 lb

## 2022-10-21 DIAGNOSIS — N200 Calculus of kidney: Secondary | ICD-10-CM

## 2022-10-21 DIAGNOSIS — N390 Urinary tract infection, site not specified: Secondary | ICD-10-CM

## 2022-10-21 DIAGNOSIS — Z87442 Personal history of urinary calculi: Secondary | ICD-10-CM | POA: Diagnosis not present

## 2022-10-21 DIAGNOSIS — B952 Enterococcus as the cause of diseases classified elsewhere: Secondary | ICD-10-CM | POA: Insufficient documentation

## 2022-10-21 DIAGNOSIS — M545 Low back pain, unspecified: Secondary | ICD-10-CM | POA: Diagnosis not present

## 2022-10-21 MED ORDER — CIPROFLOXACIN HCL 250 MG PO TABS: 250.0000 mg | ORAL_TABLET | Freq: Two times a day (BID) | ORAL | 0 refills | Status: AC

## 2022-10-21 NOTE — Assessment & Plan Note (Signed)
Bilateral, paraspinus muscles.  Not likely related to UTI given intermittent quality

## 2022-10-21 NOTE — Assessment & Plan Note (Addendum)
She had a 10 mm x 5 mm x 5 mm stone in the left renal pelvis noted on Dec  2023 CT ;  repeat KUB tomorrow

## 2022-10-21 NOTE — Assessment & Plan Note (Addendum)
Confirmed with URine culture obtained on Feb 26.   Pansesnstive,  PCN allergy. Given her history of left renal stone (Dec 2023  CT) will treat for 7 days and repeat plain film .  Daily use of a probiotic advised for 3 weeks.

## 2022-10-21 NOTE — Progress Notes (Signed)
Virtual Visit via [place]   Note   This format is felt to be most appropriate for this patient at this time.  All issues noted in this document were discussed and addressed.  No physical exam was performed (except for noted visual exam findings with Video Visits).   I connected with Amy Oliver  on 10/21/22 at 12:00 PM EST by a video enabled telemedicine application and verified that I am speaking with the correct person using two identifiers. Location patient: home Location provider: work or home office Persons participating in the virtual visit: patient, provider  I discussed the limitations, risks, security and privacy concerns of performing an evaluation and management service by telephone and the availability of in person appointments. I also discussed with the patient that there may be a patient responsible charge related to this service. The patient expressed understanding and agreed to proceed.  Reason for visit: dysuria, back pain, nausea   HPI:  62 yr old female with history of left sided nephrolithiasis in Dec 2023 presents with dysuria, , foul odor for several days, also noting bilateral back pain . Recently has had loose stools,  denies fevers,  but has been having several month history of cold flashes (NOT hot flasheS)  Reviewed December CT pelvis,  December urinalysi/cutlure (negative for infection) and curret UA/culture obtained Feb 26    ROS: See pertinent positives and negatives per HPI.  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 Stereotactic biopsy clip not removed during the procedure. Patient declined further surgical  intervention. Follow-up at [facility].Marland Kitchen   CESAREAN SECTION     2   CHOLECYSTECTOMY  2007   COLONOSCOPY WITH PROPOFOL N/A 08/07/2018   Procedure: COLONOSCOPY WITH PROPOFOL;  Surgeon: Lin Landsman, MD;  Location: [facility] ENDOSCOPY;  Service: Gastroenterology;  Laterality: N/A;   COLONOSCOPY WITH PROPOFOL N/A 05/04/2022   Procedure: COLONOSCOPY WITH PROPOFOL;  Surgeon: Lin Landsman, MD;  Location: [facility];  Service: Endoscopy;  Laterality: N/A;   LIPOMA EXCISION  2019   labia   TONSILLECTOMY      Family History  Problem Relation Age of Onset   Hypertension Father    Pulmonary fibrosis Father    Diabetes Brother    Breast cancer Paternal Grandmother    Lung cancer Paternal Aunt    Breast cancer Maternal Aunt     SOCIAL HX:  reports that she has never smoked. She has never used smokeless tobacco. She reports current alcohol use. She reports that she does not use drugs.    Current Outpatient Medications:    Ascorbic Acid (VITAMIN C ER PO), Take by mouth., Disp: , Rfl:    Cholecalciferol (BIO-D-MULSION PO), Take 5 drops by mouth daily. , Disp: , Rfl:    ciprofloxacin (CIPRO) 250 MG tablet, Take 1 tablet (250 mg total) by mouth 2 (two) times daily for 7 days., Disp: 14 tablet, Rfl: 0   CRANBERRY PO, Take 1 capsule by mouth daily., Disp: , Rfl:    Difluprednate 0.05 % EMUL, Apply 2 drops to eye 3 (three) times daily as needed. , Disp: , Rfl:    Multiple Vitamins-Minerals (ZINC PO),  Take by mouth., Disp: , Rfl:   EXAM:  VITALS per patient if applicable:  GENERAL: alert, oriented, appears well and in no acute distress  HEENT: atraumatic, conjunctiva clear, no obvious abnormalities on inspection of external nose and ears  NECK: normal movements of the head and neck  LUNGS: on inspection no signs of respiratory distress, breathing rate appears normal, no obvious gross SOB, gasping or wheezing  CV: no obvious cyanosis  MS: moves all visible extremities without  noticeable abnormality  Back :  points to bilateral lower lumbar area as source of back pain .    PSYCH/NEURO: pleasant and cooperative, no obvious depression or anxiety, speech and thought processing grossly intact  ASSESSMENT AND PLAN: UTI (urinary tract infection) due to Enterococcus Assessment & Plan: Confirmed with URine culture obtained on Feb 26.   Pansesnstive,  PCN allergy. Given her history of left renal stone (Dec 2023  CT) will treat for 7 days and repeat plain film .  Daily use of a probiotic advised for 3 weeks.     Left nephrolithiasis -     DG Abd 1 View; Future  History of nephrolithiasis Assessment & Plan: She had a 10 mm x 5 mm x 5 mm stone in the left renal pelvis noted on Dec  2023 CT ;  repeat KUB tomorrow    Low back pain, episodic Assessment & Plan: Bilateral, paraspinus muscles.  Not likely related to UTI given intermittent quality   Other orders -     Ciprofloxacin HCl; Take 1 tablet (250 mg total) by mouth 2 (two) times daily for 7 days.  Dispense: 14 tablet; Refill: 0      I discussed the assessment and treatment plan with the patient. The patient was provided an opportunity to ask questions and all were answered. The patient agreed with the plan and demonstrated an understanding of the instructions.   The patient was advised to call back or seek an in-person evaluation if the symptoms worsen or if the condition fails to improve as anticipated.   I spent 30 minutes dedicated to the care of this patient on the date of this encounter to include pre-visit review of his medical history,  Face-to-face time with the patient , and post visit ordering of testing and therapeutics.    Crecencio Mc, MD

## 2022-10-22 ENCOUNTER — Other Ambulatory Visit: Payer: BC Managed Care – PPO

## 2022-10-22 ENCOUNTER — Ambulatory Visit (INDEPENDENT_AMBULATORY_CARE_PROVIDER_SITE_OTHER): Payer: BC Managed Care – PPO

## 2022-10-22 DIAGNOSIS — N200 Calculus of kidney: Secondary | ICD-10-CM

## 2022-10-31 DIAGNOSIS — S3210XA Unspecified fracture of sacrum, initial encounter for closed fracture: Secondary | ICD-10-CM | POA: Diagnosis not present

## 2022-11-09 DIAGNOSIS — S3210XA Unspecified fracture of sacrum, initial encounter for closed fracture: Secondary | ICD-10-CM | POA: Diagnosis not present

## 2022-11-29 ENCOUNTER — Telehealth: Payer: BC Managed Care – PPO | Admitting: Internal Medicine

## 2022-11-29 ENCOUNTER — Encounter: Payer: Self-pay | Admitting: Internal Medicine

## 2022-11-29 ENCOUNTER — Telehealth: Payer: Self-pay | Admitting: Internal Medicine

## 2022-11-29 VITALS — Ht 65.0 in | Wt 218.0 lb

## 2022-11-29 DIAGNOSIS — Z87442 Personal history of urinary calculi: Secondary | ICD-10-CM | POA: Diagnosis not present

## 2022-11-29 DIAGNOSIS — R197 Diarrhea, unspecified: Secondary | ICD-10-CM | POA: Diagnosis not present

## 2022-11-29 DIAGNOSIS — N390 Urinary tract infection, site not specified: Secondary | ICD-10-CM | POA: Diagnosis not present

## 2022-11-29 NOTE — Progress Notes (Signed)
Virtual Visit via Caregility   Note   This format is felt to be most appropriate for this patient at this time.  All issues noted in this document were discussed and addressed.  No physical exam was performed (except for noted visual exam findings with Video Visits).   I connected with Amy Oliver on 11/29/22 at  4:00 PM EDT by a video enabled telemedicine application  and verified that I am speaking with the correct person using two identifiers. Location patient: home Location provider: work or home office Persons participating in the virtual visit: patient, provider  I discussed the limitations, risks, security and privacy concerns of performing an evaluation and management service by telephone and the availability of in person appointments. I also discussed with the patient that there may be a patient responsible charge related to this service. The patient expressed understanding and agreed to proceed.  Reason for visit: left flank pain   HPI:  62 yr old female with history of nephrolithiasis ,  recent UTI treated Feb 26 with cipro 250 mg bid x 7 days for E Coli pan sensitive .  Developed water diarrhea despite taking probiotics,  no stool studies yet ,  improved but not resolvde with supplements.  Now having flank pain,  mild nausea no hematurai or fevers.  But has lost 6 lbs uninentionally due to anorexia, feeling lousy,   Seeing [facility] nephrologist tomorrow  at 2 pm .  Has a 1 cm stone in left renal pelvis by Dec 2023 CT scan.  KUB done march 1 suggested no change    ROS: See pertinent positives and negatives per HPI.  Past Medical History:  Diagnosis Date   Family history of adverse reaction to anesthesia    Mother had some breathing issues, 1 time, "A long time ago"   History of nephrolithiasis    Mass of perineum, female 03/24/2018    Past Surgical History:  Procedure Laterality Date   BREAST SURGERY Right 08/09/2013   Open biopsy for microcalcifications identification of atypical  ductal hyperplasia. Stereotactic biopsy clip not removed during the procedure. Patient declined further surgical intervention. Follow-up at [facility].Marland Kitchen   CESAREAN SECTION     2   CHOLECYSTECTOMY  2007   COLONOSCOPY WITH PROPOFOL N/A 08/07/2018   Procedure: COLONOSCOPY WITH PROPOFOL;  Surgeon: Toney Reil, MD;  Location: [facility] ENDOSCOPY;  Service: Gastroenterology;  Laterality: N/A;   COLONOSCOPY WITH PROPOFOL N/A 05/04/2022   Procedure: COLONOSCOPY WITH PROPOFOL;  Surgeon: Toney Reil, MD;  Location: [facility];  Service: Endoscopy;  Laterality: N/A;   LIPOMA EXCISION  2019   labia   TONSILLECTOMY      Family History  Problem Relation Age of Onset   Hypertension Father    Pulmonary fibrosis Father    Diabetes Brother    Breast cancer Paternal Grandmother    Lung cancer Paternal Aunt    Breast cancer Maternal Aunt     SOCIAL HX:  reports that she has never smoked. She has never used smokeless tobacco. She reports current alcohol use. She reports that she does not use drugs.    Current Outpatient Medications:    Ascorbic Acid (VITAMIN C ER PO), Take by mouth., Disp: , Rfl:    B COMPLEX-BIOTIN-FA PO, Take 5 tablets by mouth daily., Disp: , Rfl:    BLACK CURRANT SEED OIL PO, Take 4 capsules by mouth daily., Disp: , Rfl:    Coenzyme Q10 (COQ10) 100 MG CAPS, Take 1 tablet by mouth  daily., Disp: , Rfl:    Difluprednate 0.05 % EMUL, Apply 2 drops to eye 3 (three) times daily as needed., Disp: , Rfl:    Multiple Vitamins-Minerals (ZINC PO), Take by mouth., Disp: , Rfl:    Quercetin 500 MG CAPS, Take 1 capsule by mouth daily., Disp: , Rfl:    Turmeric (CURCUMIN 95) 500 MG CAPS, Take 5 tablets by mouth daily., Disp: , Rfl:    UNABLE TO FIND, Take 3 tablets by mouth daily. Med Name: Beta-Plus, Disp: , Rfl:    UNABLE TO FIND, Take 1 tablet by mouth daily. Med Name: Iodomere 200 mg, Disp: , Rfl:    UNABLE TO FIND, Take 10 drops by mouth daily. Med Name: Phos Food Liquid, Disp:  , Rfl:   EXAM:  VITALS per patient if applicable:  GENERAL: alert, oriented, appears well and in no acute distress  HEENT: atraumatic, conjunttiva clear, no obvious abnormalities on inspection of external nose and ears  NECK: normal movements of the head and neck  LUNGS: on inspection no signs of respiratory distress, breathing rate appears normal, no obvious gross SOB, gasping or wheezing  CV: no obvious cyanosis  MS: moves all visible extremities without noticeable abnormality  PSYCH/NEURO: pleasant and cooperative, no obvious depression or anxiety, speech and thought processing grossly intact  ASSESSMENT AND PLAN: Diarrhea of presumed infectious origin -     GI pathogen panel by PCR, stool; Future -     C Difficile Quick Screen w PCR reflex; Future -     Ova and parasite examination; Future  Recurrent UTI -     Urinalysis, Routine w reflex microscopic; Future -     Urine Culture; Future -     CBC with Differential/Platelet; Future  History of nephrolithiasis Assessment & Plan: She will need additional imaging to rule out hydronephrosis . She has an appt at [facility] tomorow  Orders: -     Basic metabolic panel; Future -     CBC with Differential/Platelet; Future      I discussed the assessment and treatment plan with the patient. The patient was provided an opportunity to ask questions and all were answered. The patient agreed with the plan and demonstrated an understanding of the instructions.   The patient was advised to call back or seek an in-person evaluation if the symptoms worsen or if the condition fails to improve as anticipated.   I spent 30 minutes dedicated to the care of this patient on the date of this encounter to include pre-visit review of his medical history,   previous imaging studies  and urinalyses, Face-to-face time with the patient , and post visit ordering of testing and therapeutics.    Sherlene Shams, MD

## 2022-11-29 NOTE — Assessment & Plan Note (Signed)
She will need additional imaging to rule out hydronephrosis . She has an appt at [facility] tomorow

## 2022-11-30 DIAGNOSIS — N200 Calculus of kidney: Secondary | ICD-10-CM | POA: Diagnosis not present

## 2022-11-30 DIAGNOSIS — R1084 Generalized abdominal pain: Secondary | ICD-10-CM | POA: Diagnosis not present

## 2022-12-01 NOTE — Telephone Encounter (Signed)
Pt would like to be called regarding testing

## 2022-12-01 NOTE — Telephone Encounter (Signed)
Pt is aware of message and gave a verbal understanding.

## 2022-12-01 NOTE — Telephone Encounter (Signed)
Spoke with pt and she stated that she has not dropped off a stool sample at [facility] or had the blood work done yet because she has now been "completely constipated for 36 hours". She stated that she has taken two ducolax earlier today but it has not worked yet. Pt also stated that she saw urology today and they did a UA and scheduled her for lithotripsy because her stone is 13 mm. Pt is wanting to know what else she can take to help her have a bowel movement.

## 2022-12-07 DIAGNOSIS — S322XXA Fracture of coccyx, initial encounter for closed fracture: Secondary | ICD-10-CM | POA: Diagnosis not present

## 2022-12-14 DIAGNOSIS — R1084 Generalized abdominal pain: Secondary | ICD-10-CM | POA: Diagnosis not present

## 2022-12-14 DIAGNOSIS — N23 Unspecified renal colic: Secondary | ICD-10-CM | POA: Diagnosis not present

## 2022-12-14 DIAGNOSIS — N200 Calculus of kidney: Secondary | ICD-10-CM | POA: Diagnosis not present

## 2022-12-14 DIAGNOSIS — R1110 Vomiting, unspecified: Secondary | ICD-10-CM | POA: Diagnosis not present

## 2022-12-14 DIAGNOSIS — R3915 Urgency of urination: Secondary | ICD-10-CM | POA: Diagnosis not present

## 2022-12-14 DIAGNOSIS — R1032 Left lower quadrant pain: Secondary | ICD-10-CM | POA: Diagnosis not present

## 2022-12-14 DIAGNOSIS — N132 Hydronephrosis with renal and ureteral calculous obstruction: Secondary | ICD-10-CM | POA: Diagnosis not present

## 2022-12-14 DIAGNOSIS — R112 Nausea with vomiting, unspecified: Secondary | ICD-10-CM | POA: Diagnosis not present

## 2022-12-14 DIAGNOSIS — N201 Calculus of ureter: Secondary | ICD-10-CM | POA: Diagnosis not present

## 2022-12-14 DIAGNOSIS — N2889 Other specified disorders of kidney and ureter: Secondary | ICD-10-CM | POA: Diagnosis not present

## 2022-12-14 DIAGNOSIS — R109 Unspecified abdominal pain: Secondary | ICD-10-CM | POA: Diagnosis not present

## 2022-12-16 ENCOUNTER — Other Ambulatory Visit: Payer: Self-pay

## 2022-12-16 ENCOUNTER — Encounter: Payer: Self-pay | Admitting: Intensive Care

## 2022-12-16 ENCOUNTER — Emergency Department
Admission: EM | Admit: 2022-12-16 | Discharge: 2022-12-16 | Disposition: A | Discharge status: Home / Self Care | Payer: BC Managed Care – PPO | Attending: Emergency Medicine | Admitting: Emergency Medicine

## 2022-12-16 DIAGNOSIS — K5900 Constipation, unspecified: Secondary | ICD-10-CM | POA: Diagnosis not present

## 2022-12-16 DIAGNOSIS — D72829 Elevated white blood cell count, unspecified: Secondary | ICD-10-CM | POA: Diagnosis not present

## 2022-12-16 DIAGNOSIS — R109 Unspecified abdominal pain: Secondary | ICD-10-CM | POA: Diagnosis not present

## 2022-12-16 DIAGNOSIS — Z8616 Personal history of COVID-19: Secondary | ICD-10-CM | POA: Diagnosis not present

## 2022-12-16 DIAGNOSIS — R1084 Generalized abdominal pain: Secondary | ICD-10-CM | POA: Diagnosis not present

## 2022-12-16 LAB — URINALYSIS, ROUTINE W REFLEX MICROSCOPIC
Bilirubin Urine: NEGATIVE
Glucose, UA: NEGATIVE mg/dL
Ketones, ur: 5 mg/dL — AB
Leukocytes,Ua: NEGATIVE
Nitrite: NEGATIVE
Protein, ur: NEGATIVE mg/dL
Specific Gravity, Urine: 1.021 (ref 1.005–1.030)
pH: 5 (ref 5.0–8.0)

## 2022-12-16 LAB — COMPREHENSIVE METABOLIC PANEL
ALT: 15 U/L (ref 0–44)
AST: 19 U/L (ref 15–41)
Albumin: 4 g/dL (ref 3.5–5.0)
Alkaline Phosphatase: 68 U/L (ref 38–126)
Anion gap: 9 (ref 5–15)
BUN: 16 mg/dL (ref 6–20)
CO2: 22 mmol/L (ref 22–32)
Calcium: 9.5 mg/dL (ref 8.9–10.3)
Chloride: 105 mmol/L (ref 98–111)
Creatinine, Ser: 1.31 mg/dL — ABNORMAL HIGH (ref 0.44–1.00)
GFR, Estimated: 47 mL/min — ABNORMAL LOW (ref >60)
Glucose, Bld: 128 mg/dL — ABNORMAL HIGH (ref 70–99)
Potassium: 3.8 mmol/L (ref 3.5–5.1)
Sodium: 136 mmol/L (ref 135–145)
Total Bilirubin: 1.1 mg/dL (ref 0.3–1.2)
Total Protein: 7.4 g/dL (ref 6.5–8.1)

## 2022-12-16 LAB — CBC WITH DIFFERENTIAL/PLATELET
Abs Immature Granulocytes: 0.04 10*3/uL (ref 0.00–0.07)
Basophils Absolute: 0 10*3/uL (ref 0.0–0.1)
Basophils Relative: 0 %
Eosinophils Absolute: 0 10*3/uL (ref 0.0–0.5)
Eosinophils Relative: 0 %
HCT: 38.4 % (ref 36.0–46.0)
Hemoglobin: 13.1 g/dL (ref 12.0–15.0)
Immature Granulocytes: 0 %
Lymphocytes Relative: 11 %
Lymphs Abs: 1.3 10*3/uL (ref 0.7–4.0)
MCH: 30 pg (ref 26.0–34.0)
MCHC: 34.1 g/dL (ref 30.0–36.0)
MCV: 88.1 fL (ref 80.0–100.0)
Monocytes Absolute: 0.9 10*3/uL (ref 0.1–1.0)
Monocytes Relative: 8 %
Neutro Abs: 9.5 10*3/uL — ABNORMAL HIGH (ref 1.7–7.7)
Neutrophils Relative %: 81 %
Platelets: 422 10*3/uL — ABNORMAL HIGH (ref 150–400)
RBC: 4.36 MIL/uL (ref 3.87–5.11)
RDW: 12.3 % (ref 11.5–15.5)
WBC: 11.9 10*3/uL — ABNORMAL HIGH (ref 4.0–10.5)
nRBC: 0 % (ref 0.0–0.2)

## 2022-12-16 LAB — LIPASE, BLOOD: Lipase: 25 U/L (ref 11–51)

## 2022-12-16 MED ORDER — LACTATED RINGERS IV BOLUS
1000.0000 mL | Freq: Once | INTRAVENOUS | Status: AC
  Administered 2022-12-16: 1000 mL via INTRAVENOUS

## 2022-12-16 MED ORDER — ONDANSETRON HCL 4 MG/2ML IJ SOLN
4.0000 mg | Freq: Once | INTRAMUSCULAR | Status: AC
  Administered 2022-12-16: 4 mg via INTRAVENOUS
  Filled 2022-12-16: qty 2

## 2022-12-16 MED ORDER — FLEET ENEMA 7-19 GM/118ML RE ENEM
1.0000 | ENEMA | Freq: Once | RECTAL | Status: AC
  Administered 2022-12-16: 1 via RECTAL

## 2022-12-16 MED ORDER — MORPHINE SULFATE (PF) 4 MG/ML IV SOLN
4.0000 mg | Freq: Once | INTRAVENOUS | Status: AC
  Administered 2022-12-16: 4 mg via INTRAVENOUS
  Filled 2022-12-16: qty 1

## 2022-12-16 MED ORDER — KETOROLAC TROMETHAMINE 30 MG/ML IJ SOLN
15.0000 mg | Freq: Once | INTRAMUSCULAR | Status: AC
  Administered 2022-12-16: 15 mg via INTRAVENOUS
  Filled 2022-12-16: qty 1

## 2022-12-16 NOTE — Discharge Instructions (Signed)
Please start taking the MiraLAX 1 capful in the morning and 1 capful in the evening.  You can continue to take the Dulcolax as you have.  You can take the enema or a suppository at home as well.  Please make sure you are drinking plenty of water as this will help with constipation.  In addition to the hydrocodone please start taking 400 mg of ibuprofen every 6-8 hours.  You can take the Zofran as needed for nausea and vomiting.  If your pain is not controlled either please follow-up with your urologist or return to the emergency department.

## 2022-12-16 NOTE — ED Provider Notes (Signed)
[facility] Provider Note    Event Date/Time   First MD Initiated Contact with Patient 12/16/22 [id number]     (approximate)   History   Abdominal Pain   HPI  Amy Oliver is a 62 y.o. female past medical history of kidney stones who presents because of flank and abdominal pain and constipation.  Patient underwent lithotripsy on Tuesday, 2 days ago.  Since that time she has a severe left flank pain as well as generalized abdominal pain.  Pain is constant does improve somewhat after hydrocodone but then comes back.  Takes about an hour for the hydrocodone to start working.  She has had nausea but no vomiting no urinary symptoms no fevers or chills.  She is significantly constipated last BM was on Saturday, 5 days ago.  Patient says that the beginning of February she was having diarrhea for several weeks but then this resolved and has had some issues with constipation since that has to take a Dulcolax so every other day but typically go about every other day.  It is unusual for her abdominal 5 days.  She says she is having significant gas pain resulting in generalized abdominal pain that is different from her left flank pain from the lithotripsy.  She has belching but not passing flatus.  History of 2 prior C-sections.  Patient went to [facility] ED on 4/23 the evening after her lithotripsy had CT of the abdomen pelvis without that showed distal ureteral stone consistent with recent lithotripsy but no's findings to suggest bowel obstruction.     Past Medical History:  Diagnosis Date   Family history of adverse reaction to anesthesia    Mother had some breathing issues, 1 time, "A long time ago"   History of nephrolithiasis    Mass of perineum, female 03/24/2018    Patient Active Problem List   Diagnosis Date Noted   UTI (urinary tract infection) due to Enterococcus 10/21/2022   Low back pain, episodic 10/21/2022   Left nephrolithiasis 08/11/2022   History of  colonic polyps    Tubular adenoma of colon 10/04/2020   History of COVID-19 10/01/2019   Encounter for screening colonoscopy    Edema of both lower extremities 04/18/2018   Long-term use of high-risk medication 06/02/2017   Hyperlipidemia 01/18/2016   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   Encounter for preventive health examination 09/11/2013   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   S/P breast biopsy, right 07/31/2013   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 --      Excl. in GC? --  Most recent vital signs: Vitals:   12/16/22 0755  BP: 127/61  Pulse: 97  Resp: 20  Temp: 98.4 F (36.9 C)  SpO2: 100%     General: Awake, patient is uncomfortable appearing CV:  Good peripheral perfusion.  Resp:  Normal effort.  Abd:  No distention.  Mild tenderness diffusely but abdomen is soft Neuro:             Awake, Alert, Oriented x 3  Other:  No CVA tenderness   ED Results / Procedures / Treatments  Labs (all labs ordered are listed, but only abnormal results are displayed) Labs Reviewed  COMPREHENSIVE METABOLIC PANEL - Abnormal; Notable for the following components:      Result Value   Glucose, Bld 128 (*)    Creatinine, Ser 1.31 (*)    GFR, Estimated 47 (*)    All other components within normal limits  CBC WITH DIFFERENTIAL/PLATELET - Abnormal; Notable for the following components:    WBC 11.9 (*)    Platelets 422 (*)    Neutro Abs 9.5 (*)    All other components within normal limits  URINALYSIS, ROUTINE W REFLEX MICROSCOPIC - Abnormal; Notable for the following components:   Color, Urine YELLOW (*)    APPearance HAZY (*)    Hgb urine dipstick MODERATE (*)    Ketones, ur 5 (*)    Bacteria, UA RARE (*)    All other components within normal limits  LIPASE, BLOOD     EKG    RADIOLOGY    PROCEDURES:  Critical Care performed: No  Procedures  The patient is on the cardiac monitor to evaluate for evidence of arrhythmia and/or significant heart rate changes.   MEDICATIONS ORDERED IN ED: Medications  sodium phosphate (FLEET) 7-19 GM/118ML enema 1 enema (has no administration in time range)  ketorolac (TORADOL) 30 MG/ML injection 15 mg (15 mg Intravenous Given 12/16/22 0830)  ondansetron (ZOFRAN) injection 4 mg (4 mg Intravenous Given 12/16/22 0830)  lactated ringers bolus 1,000 mL (0 mLs Intravenous Stopped 12/16/22 0929)  morphine (PF) 4 MG/ML injection 4 mg (4 mg Intravenous Given 12/16/22 0831)     IMPRESSION / MDM / ASSESSMENT AND PLAN / ED COURSE  I reviewed the triage vital signs and the nursing notes.                              Patient's presentation is most consistent with acute complicated illness / injury requiring diagnostic workup.  Differential diagnosis includes, but is not limited to, pain related to recent lithotripsy, low suspicion for pyelonephritis, constipation related pain, bowel obstruction  Patient is a 62 year old female presents with abdominal pain flank pain constipation.  Recently underwent lithotripsy 2 days ago at [facility].  Since that time said significant left-sided flank pain and has developed generalized abdominal pain.  Her left flank pain is discrete.  Generalized abdominal pain but the generalized abdominal pain did start the evening after her lithotripsy.  She has no urinary symptoms no fever has had some nausea but no  vomiting.  She is also constipated has not had BM in 5 days.  She is taking hydrocodone which has been taking since Tuesday not before.  Has had some recent issues with constipation taking Dulcolax.  Patient looks uncomfortable on my exam.  She does not have CVA tenderness.  She does have some diffuse abdominal tenderness but abdominal exam is benign and is soft there is no  guarding no peritoneal findings.  I suspect that patient's pain is in combination of constipation and also pain related to recent left lithotripsy.  Patient did have a CT of her abdomen pelvis just 2 days ago at [facility].  While I am thinking about possible bowel obstruction, abdominal exam is really not consistent with this and given pain was similar 2 days ago without findings to suggest bowel obstruction I feel that this is less likely.  Will attempt to control patient's pain with Toradol and morphine give a bolus of fluid.  Will check some labs and a urinalysis.  Patient currently on cefdinir for possible UTI.  If pain is refractory may need repeat imaging.  Labs are overall reassuring.  Does have mild AKI creatinine 1.3 was 0.82 days ago.  Patient was rehydrated with IV fluid.  Her white count is mildly elevated at 12.  Urinalysis with 6-10 red cells and white cells consistent with recently passed stone the patient is already on cefdinir.  On repeat assessment after Toradol and morphine patient's pain is gone.  She looks comfortable.  Repeat abdominal exam and she is nontender throughout and soft she is passing flatus here.  My suspicion for bowel obstruction is low.  Discussed with her continuing the hydrocodone that she is prescribed as well as adding on an NSAID for 400 mg of ibuprofen every 6 hours she is not currently taking an NSAID and this will help with her ureteral colic.  I have also discussed adding on MiraLAX twice a day until she is having regular bowel movements.  Will provide enema for patient to take at home she did  not want to try this here.  We discussed follow-up with urology and return precautions.       FINAL CLINICAL IMPRESSION(S) / ED DIAGNOSES   Final diagnoses:  Flank pain  Constipation, unspecified constipation type     Rx / DC Orders   ED Discharge Orders     None        Note:  This document was prepared using Dragon voice recognition software and may include unintentional dictation errors.   Georga Hacking, MD 12/16/22 [phone]

## 2022-12-16 NOTE — ED Notes (Addendum)
Pt requesting to speak with doctor prior to discharge. Dr. Sidney Ace notified.

## 2022-12-16 NOTE — ED Triage Notes (Signed)
Patient had lithotripsy recently and reports it did not go well. C/o abdominal pain and left lower back pain. Intermittent N/V. Patient states "the gas is unbearable. I haven't had a BM since Saturday"   Tearful during triage. A&O x4

## 2022-12-16 NOTE — ED Notes (Signed)
AVS provided to and discussed with patient and family member at bedside. Pt verbalizes understanding of discharge instructions and denies any questions or concerns at this time. Pt has ride home. Pt ambulated out of department independently with steady gait.  

## 2022-12-17 ENCOUNTER — Emergency Department: Payer: BC Managed Care – PPO

## 2022-12-17 ENCOUNTER — Emergency Department
Admission: EM | Admit: 2022-12-17 | Discharge: 2022-12-17 | Disposition: A | Discharge status: Other Acute Inpatient Hospital | Payer: BC Managed Care – PPO | Attending: Emergency Medicine | Admitting: Emergency Medicine

## 2022-12-17 DIAGNOSIS — Z6834 Body mass index (BMI) 34.0-34.9, adult: Secondary | ICD-10-CM | POA: Diagnosis not present

## 2022-12-17 DIAGNOSIS — D72829 Elevated white blood cell count, unspecified: Secondary | ICD-10-CM | POA: Insufficient documentation

## 2022-12-17 DIAGNOSIS — N201 Calculus of ureter: Secondary | ICD-10-CM | POA: Diagnosis not present

## 2022-12-17 DIAGNOSIS — G8918 Other acute postprocedural pain: Secondary | ICD-10-CM | POA: Diagnosis not present

## 2022-12-17 DIAGNOSIS — Z880 Allergy status to penicillin: Secondary | ICD-10-CM | POA: Diagnosis not present

## 2022-12-17 DIAGNOSIS — N132 Hydronephrosis with renal and ureteral calculous obstruction: Secondary | ICD-10-CM | POA: Diagnosis not present

## 2022-12-17 DIAGNOSIS — R109 Unspecified abdominal pain: Secondary | ICD-10-CM | POA: Insufficient documentation

## 2022-12-17 DIAGNOSIS — Z743 Need for continuous supervision: Secondary | ICD-10-CM | POA: Diagnosis not present

## 2022-12-17 DIAGNOSIS — N200 Calculus of kidney: Secondary | ICD-10-CM | POA: Diagnosis not present

## 2022-12-17 DIAGNOSIS — I959 Hypotension, unspecified: Secondary | ICD-10-CM | POA: Diagnosis not present

## 2022-12-17 LAB — BASIC METABOLIC PANEL
Anion gap: 9 (ref 5–15)
BUN: 15 mg/dL (ref 6–20)
CO2: 22 mmol/L (ref 22–32)
Calcium: 9.4 mg/dL (ref 8.9–10.3)
Chloride: 105 mmol/L (ref 98–111)
Creatinine, Ser: 1.05 mg/dL — ABNORMAL HIGH (ref 0.44–1.00)
GFR, Estimated: >60 mL/min (ref >60)
Glucose, Bld: 104 mg/dL — ABNORMAL HIGH (ref 70–99)
Potassium: 3.7 mmol/L (ref 3.5–5.1)
Sodium: 136 mmol/L (ref 135–145)

## 2022-12-17 LAB — URINALYSIS, ROUTINE W REFLEX MICROSCOPIC
Bilirubin Urine: NEGATIVE
Glucose, UA: NEGATIVE mg/dL
Ketones, ur: 5 mg/dL — AB
Nitrite: NEGATIVE
Protein, ur: 30 mg/dL — AB
Specific Gravity, Urine: 1.024 (ref 1.005–1.030)
pH: 5 (ref 5.0–8.0)

## 2022-12-17 LAB — CBC
HCT: 39 % (ref 36.0–46.0)
Hemoglobin: 12.8 g/dL (ref 12.0–15.0)
MCH: 29.4 pg (ref 26.0–34.0)
MCHC: 32.8 g/dL (ref 30.0–36.0)
MCV: 89.4 fL (ref 80.0–100.0)
Platelets: 377 10*3/uL (ref 150–400)
RBC: 4.36 MIL/uL (ref 3.87–5.11)
RDW: 12.2 % (ref 11.5–15.5)
WBC: 10.9 10*3/uL — ABNORMAL HIGH (ref 4.0–10.5)
nRBC: 0 % (ref 0.0–0.2)

## 2022-12-17 MED ORDER — MORPHINE SULFATE (PF) 4 MG/ML IV SOLN
4.0000 mg | Freq: Once | INTRAVENOUS | Status: AC
  Administered 2022-12-17: 4 mg via INTRAVENOUS
  Filled 2022-12-17: qty 1

## 2022-12-17 MED ORDER — ONDANSETRON HCL 4 MG/2ML IJ SOLN
4.0000 mg | Freq: Once | INTRAMUSCULAR | Status: AC
  Administered 2022-12-17: 4 mg via INTRAVENOUS
  Filled 2022-12-17: qty 2

## 2022-12-17 MED ORDER — SODIUM CHLORIDE 0.9 % IV BOLUS
500.0000 mL | Freq: Once | INTRAVENOUS | Status: AC
  Administered 2022-12-17: 500 mL via INTRAVENOUS

## 2022-12-17 MED ORDER — MORPHINE SULFATE (PF) 4 MG/ML IV SOLN: 4.0000 mg | INTRAVENOUS | Status: DC | PRN

## 2022-12-17 NOTE — ED Triage Notes (Signed)
Pt sts that she had a lithotripsy on 04/23 and since than pt sts that she has been having extreme back pain. Pt sts that she had the procedure at [facility]. Pt sts that she has been to [facility] and here for the pain. Pt sts that she advised her urology dept that pt needed to come and be seen and for a CT scan.

## 2022-12-17 NOTE — ED Notes (Signed)
Report given to [facility].

## 2022-12-17 NOTE — ED Notes (Signed)
Pt accepted at [facility]. Awaiting bed assignment

## 2022-12-17 NOTE — ED Notes (Signed)
Attempted to call report to 7E at [facility]. RN was busy and will call back.

## 2022-12-17 NOTE — ED Notes (Signed)
EMTALA reviewed by this RN.  

## 2022-12-17 NOTE — ED Provider Notes (Signed)
[facility] Provider Note    Event Date/Time   First MD Initiated Contact with Patient 12/17/22 1427     (approximate)   History   Post-op Problem   HPI  Amy Oliver is a 62 y.o. female who had lithotripsy earlier this week for left-sided kidney stone, she has had multiple visits to separate ED since then because of recurrent pain.  No fevers, no dysuria reported.  Had been doing better but now pain has returned.  Procedure was initially done at [facility], urologist recommended repeat CT     Physical Exam   Triage Vital Signs: ED Triage Vitals  Enc Vitals Group     BP 12/17/22 1252 129/88     Pulse Rate 12/17/22 1252 79     Resp 12/17/22 1252 17     Temp 12/17/22 1252 97.9 F (36.6 C)     Temp Source 12/17/22 1252 Oral     SpO2 12/17/22 1252 98 %     Weight 12/17/22 1253 93.9 kg (207 lb)     Height --      Head Circumference --      Peak Flow --      Pain Score 12/17/22 1253 10     Pain Loc --      Pain Edu? --      Excl. in GC? --     Most recent vital signs: Vitals:   12/17/22 1555 12/17/22 1715  BP: 104/72   Pulse: 74 77  Resp: 18   Temp:    SpO2: 96% 95%     General: Awake, uncomfortable CV:  Good peripheral perfusion.  Resp:  Normal effort.  Abd:  No distention.  Other:     ED Results / Procedures / Treatments   Labs (all labs ordered are listed, but only abnormal results are displayed) Labs Reviewed  URINALYSIS, ROUTINE W REFLEX MICROSCOPIC - Abnormal; Notable for the following components:      Result Value   Color, Urine YELLOW (*)    APPearance CLEAR (*)    Hgb urine dipstick MODERATE (*)    Ketones, ur 5 (*)    Protein, ur 30 (*)    Leukocytes,Ua SMALL (*)    Bacteria, UA FEW (*)    All other components within normal limits  CBC - Abnormal; Notable for the following components:   WBC 10.9 (*)    All other components within normal limits  BASIC METABOLIC PANEL - Abnormal; Notable for the following  components:   Glucose, Bld 104 (*)    Creatinine, Ser 1.05 (*)    All other components within normal limits     EKG     RADIOLOGY CT renal stone study demonstrates proximal stones    PROCEDURES:  Critical Care performed:   Procedures   MEDICATIONS ORDERED IN ED: Medications  morphine (PF) 4 MG/ML injection 4 mg (4 mg Intravenous Given 12/17/22 1537)  ondansetron (ZOFRAN) injection 4 mg (4 mg Intravenous Given 12/17/22 1535)  sodium chloride 0.9 % bolus 500 mL (0 mLs Intravenous Stopped 12/17/22 1715)     IMPRESSION / MDM / ASSESSMENT AND PLAN / ED COURSE  I reviewed the triage vital signs and the nursing notes. Patient's presentation is most consistent with acute presentation with potential threat to life or bodily function.  Patient presents with flank pain as above that is severe.  She is afebrile.  Differential includes ureterolithiasis, infection/UTI,  Lab work reviewed, mild elevation of white blood  cell count is nonspecific otherwise lab work is reassuring, CT demonstrates multiple proximal ureteral stones  Patient feeling better after IV morphine and IV Zofran  CT scan demonstrates proximal stones, she and family requested transfer to [facility], transfer center contacted  Accept did by urology at [facility], we will arrange for transfer      FINAL CLINICAL IMPRESSION(S) / ED DIAGNOSES   Final diagnoses:  Post-operative pain     Rx / DC Orders   ED Discharge Orders     None        Note:  This document was prepared using Dragon voice recognition software and may include unintentional dictation errors.   Jene Every, MD 12/17/22 [phone]

## 2022-12-17 NOTE — ED Notes (Signed)
Called [facility] for update, pt on list for transport after shift change

## 2022-12-18 DIAGNOSIS — N132 Hydronephrosis with renal and ureteral calculous obstruction: Secondary | ICD-10-CM | POA: Diagnosis not present

## 2022-12-19 DIAGNOSIS — Z9889 Other specified postprocedural states: Secondary | ICD-10-CM | POA: Diagnosis not present

## 2022-12-19 DIAGNOSIS — N200 Calculus of kidney: Secondary | ICD-10-CM | POA: Diagnosis not present

## 2022-12-19 DIAGNOSIS — Z87442 Personal history of urinary calculi: Secondary | ICD-10-CM | POA: Diagnosis not present

## 2022-12-19 DIAGNOSIS — Z880 Allergy status to penicillin: Secondary | ICD-10-CM | POA: Diagnosis not present

## 2022-12-19 DIAGNOSIS — R7989 Other specified abnormal findings of blood chemistry: Secondary | ICD-10-CM | POA: Diagnosis not present

## 2022-12-19 DIAGNOSIS — R079 Chest pain, unspecified: Secondary | ICD-10-CM | POA: Diagnosis not present

## 2022-12-19 DIAGNOSIS — N202 Calculus of kidney with calculus of ureter: Secondary | ICD-10-CM | POA: Diagnosis not present

## 2022-12-19 DIAGNOSIS — R1012 Left upper quadrant pain: Secondary | ICD-10-CM | POA: Diagnosis not present

## 2022-12-20 ENCOUNTER — Telehealth: Payer: Self-pay | Admitting: Internal Medicine

## 2022-12-20 NOTE — Telephone Encounter (Signed)
pt would like to be called regarding her having surgery last week and that she has been to the ER five times since then

## 2022-12-21 DIAGNOSIS — N200 Calculus of kidney: Secondary | ICD-10-CM | POA: Diagnosis not present

## 2022-12-21 DIAGNOSIS — R1084 Generalized abdominal pain: Secondary | ICD-10-CM | POA: Diagnosis not present

## 2022-12-21 NOTE — Telephone Encounter (Signed)
Spoke with pt and she stated that she just wanted to let you know that since she had lithotripsy last week she has had to go to the ED 4 fours due to severe abdominal pain and vomiting. Pt stated that she is on her why to see the urologist this morning and will give [place] a call once she is done with that appt.

## 2022-12-21 NOTE — Telephone Encounter (Signed)
Patient has scheduled an appointment to with Dr. Duncan Dull on 12/23/2022.

## 2022-12-21 NOTE — Telephone Encounter (Signed)
noted 

## 2022-12-23 ENCOUNTER — Encounter: Payer: Self-pay | Admitting: Internal Medicine

## 2022-12-23 ENCOUNTER — Telehealth: Payer: Self-pay | Admitting: Internal Medicine

## 2022-12-23 ENCOUNTER — Ambulatory Visit: Payer: BC Managed Care – PPO | Admitting: Internal Medicine

## 2022-12-23 VITALS — BP 134/82 | HR 85 | Temp 98.1°F | Ht 65.0 in | Wt 202.6 lb

## 2022-12-23 DIAGNOSIS — R109 Unspecified abdominal pain: Secondary | ICD-10-CM | POA: Diagnosis not present

## 2022-12-23 DIAGNOSIS — N200 Calculus of kidney: Secondary | ICD-10-CM | POA: Diagnosis not present

## 2022-12-23 DIAGNOSIS — N179 Acute kidney failure, unspecified: Secondary | ICD-10-CM | POA: Insufficient documentation

## 2022-12-23 DIAGNOSIS — R944 Abnormal results of kidney function studies: Secondary | ICD-10-CM | POA: Diagnosis not present

## 2022-12-23 LAB — URINALYSIS, ROUTINE W REFLEX MICROSCOPIC
Ketones, ur: 15 — AB
Nitrite: NEGATIVE
Specific Gravity, Urine: 1.015 (ref 1.000–1.030)
Total Protein, Urine: 30 — AB
Urine Glucose: NEGATIVE
Urobilinogen, UA: 0.2 (ref 0.0–1.0)
pH: 8 (ref 5.0–8.0)

## 2022-12-23 LAB — BASIC METABOLIC PANEL
BUN: 9 mg/dL (ref 6–23)
CO2: 26 mEq/L (ref 19–32)
Calcium: 9.6 mg/dL (ref 8.4–10.5)
Chloride: 102 mEq/L (ref 96–112)
Creatinine, Ser: 0.75 mg/dL (ref 0.40–1.20)
GFR: 87.01 mL/min (ref >60.00)
Glucose, Bld: 102 mg/dL — ABNORMAL HIGH (ref 70–99)
Potassium: 3.8 mEq/L (ref 3.5–5.1)
Sodium: 141 mEq/L (ref 135–145)

## 2022-12-23 LAB — MAGNESIUM: Magnesium: 2.1 mg/dL (ref 1.5–2.5)

## 2022-12-23 MED ORDER — HYOSCYAMINE SULFATE ER 0.375 MG PO TB12: 0.3750 mg | ORAL_TABLET | Freq: Two times a day (BID) | ORAL | 0 refills | Status: AC

## 2022-12-23 NOTE — Patient Instructions (Addendum)
ACIDIFY EVERY GLASS OF WATER WITH FRESH CITRUS  CHECKING KIDNEY FUNCTION TODAY.  NO IBUPROFEN UNTIL GFR IS REEVALUATED.   USE TYLENOL 1000 MG EVERY 12 HOURS.      TAKE THE OXYBUTYNIN AN FLOMAX  ONE HOUR PRIOR TO T O STENT REMOVAL  CONTINUE THE OXYBUTYNIN   AN ALTERNATIVE BLADDER SPASM MEDICATION IS HYOSCYAMINE TAKEN EVERY 12 HOURS

## 2022-12-23 NOTE — Telephone Encounter (Signed)
Pt called in staying that she was in office today for an appt, and she forgot to tell Dr. Darrick Huntsman that she sees blood in her urine.Marland Kitchen

## 2022-12-23 NOTE — Progress Notes (Signed)
Subjective:  Patient ID: Amy Oliver, female    DOB: [birth year]  Age: 62 y.o. MRN: [id number]  CC: The primary encounter diagnosis was Flank pain. Diagnoses of Decreased GFR, Left nephrolithiasis, and Acute renal failure, unspecified acute renal failure type [facility]) were also pertinent to this visit.   HPI Amy Oliver presents for ER follow up Chief Complaint  Patient presents with   Medical Management of Chronic Issues    Follow up on kidney stones     HPI:    Amy Oliver is a 62 yr old female who on April 23 underwent  lithotripsy of a 16 mm stone in the left ureter  by Urologist Amy Oliver.  No stent was placed.  She was  sent home same day , without pain meds .  At 4 pm started vomiting,  and her plain pain became excruciating .  She was treated at  [facility] ER with  morphine and sent home.   April 24 pain excruciating,  went to [facility] twice .  After 2nd visit to [facility] ER ,  which occurred on April 26 [facility]. She was [facility] to [facility] and  Stent was placed after removal of impacted stone was successful.   sent home with oxy,  antispasmodics.  Sunday woke up LUQ pain radiating to shoulder , told by on call  RN that her pain was intraabdominal "gas from anesthesia".  Following day she was REEVALUATED at  [facility]. April 28 due to persistent pain,  given oxy,  then finally 2 mg dilaudid which  helped.  Sent home.  saw Amy Oliver for follow up,  told that the stent was causing the pain advised to stop using dilaudid, (had 4 doses total).  Has had  no narcotics  since Tuesday morning.    Amy Oliver plans to remove the stent on Tuesday    Currently feeling weak, tired, some palpitations,  no fevers,  no appetite,  has lost > 10 lb since last visit unintentionally    Outpatient Medications Prior to Visit  Medication Sig Dispense Refill   acetaminophen (TYLENOL) 500 MG tablet Take 500 mg by mouth every 6 (six) hours as needed.     Ascorbic  Acid (VITAMIN C ER PO) Take by mouth.     B COMPLEX-BIOTIN-FA PO Take 5 tablets by mouth daily.     BLACK CURRANT SEED OIL PO Take 4 capsules by mouth daily.     Coenzyme Q10 (COQ10) 100 MG CAPS Take 1 tablet by mouth daily.     Difluprednate 0.05 % EMUL Apply 2 drops to eye 3 (three) times daily as needed.     ibuprofen (ADVIL) 600 MG tablet Take 600 mg by mouth every 6 (six) hours as needed.     Multiple Vitamins-Minerals (ZINC PO) Take by mouth.     Quercetin 500 MG CAPS Take 1 capsule by mouth daily.     Turmeric (CURCUMIN 95) 500 MG CAPS Take 5 tablets by mouth daily.     UNABLE TO FIND Take 3 tablets by mouth daily. Med Name: Beta-Plus     UNABLE TO FIND Take 1 tablet by mouth daily. Med Name: Iodomere 200 mg     UNABLE TO FIND Take 10 drops by mouth daily. Med Name: Phos Food Liquid     Docusate Sodium (DSS) 100 MG CAPS Take 1 capsule by mouth 2 (two) times a week. (Patient not taking: Reported on 12/23/2022)  HYDROmorphone (DILAUDID) 2 MG tablet Take 1 mg by mouth every 4 (four) hours as needed. (Patient not taking: Reported on 12/23/2022)     ondansetron (ZOFRAN-ODT) 4 MG disintegrating tablet Take 4 mg by mouth every 8 (eight) hours as needed. (Patient not taking: Reported on 12/23/2022)     oxybutynin (DITROPAN) 5 MG tablet Take 5 mg by mouth 3 (three) times daily. (Patient not taking: Reported on 12/23/2022)     tamsulosin (FLOMAX) 0.4 MG CAPS capsule Take 0.4 mg by mouth daily. (Patient not taking: Reported on 12/23/2022)     No facility-administered medications prior to visit.    Review of Systems;  Patient denies headache, fevers,  unintentional weight loss, skin rash, eye pain, sinus congestion and sinus pain, sore throat, dysphagia,  hemoptysis , cough, dyspnea, wheezing, chest pain,  orthopnea, edema, abdominal pain, nausea, melena, diarrhea, constipation, , dysuria, hematuria, urinary  Frequency, nocturia, numbness, tingling, seizures,  Focal weakness, Loss of consciousness,   Tremor, insomnia, depression, anxiety, and suicidal ideation.      Objective:  BP 134/82   Pulse 85   Temp 98.1 F (36.7 C) (Oral)   Ht 5\' 5"  (1.651 m)   Wt 202 lb 9.6 oz (91.9 kg)   LMP 09/18/2015   SpO2 96%   BMI 33.71 kg/m   BP Readings from Last 3 Encounters:  12/23/22 134/82  12/17/22 137/77  12/16/22 127/61    Wt Readings from Last 3 Encounters:  12/23/22 202 lb 9.6 oz (91.9 kg)  12/17/22 207 lb (93.9 kg)  12/16/22 207 lb (93.9 kg)    Physical Exam Vitals reviewed.  Constitutional:      General: She is not in acute distress.    Appearance: Normal appearance. She is normal weight. She is not ill-appearing, toxic-appearing or diaphoretic.  HENT:     Head: Normocephalic.  Eyes:     General: No scleral icterus.       Right eye: No discharge.        Left eye: No discharge.     Conjunctiva/sclera: Conjunctivae normal.  Cardiovascular:     Rate and Rhythm: Normal rate and regular rhythm.     Heart sounds: Normal heart sounds.  Pulmonary:     Effort: Pulmonary effort is normal. No respiratory distress.     Breath sounds: Normal breath sounds.  Musculoskeletal:        General: Normal range of motion.  Skin:    General: Skin is warm and dry.  Neurological:     General: No focal deficit present.     Mental Status: She is alert and oriented to person, place, and time. Mental status is at baseline.  Psychiatric:        Mood and Affect: Mood normal.        Behavior: Behavior normal.        Thought Content: Thought content normal.        Judgment: Judgment normal.   Lab Results  Component Value Date   HGBA1C 5.8 05/06/2022    Lab Results  Component Value Date   CREATININE 1.05 (H) 12/17/2022   CREATININE 1.31 (H) 12/16/2022   CREATININE 0.8 05/06/2022    Lab Results  Component Value Date   WBC 10.9 (H) 12/17/2022   HGB 12.8 12/17/2022   HCT 39.0 12/17/2022   PLT 377 12/17/2022   GLUCOSE 104 (H) 12/17/2022   CHOL 279 (A) 05/06/2022   TRIG 117  05/06/2022   HDL 48.70 10/01/2019   LDLDIRECT 226.5 09/28/2013  LDLCALC 218 05/06/2022   ALT 15 12/16/2022   AST 19 12/16/2022   NA 136 12/17/2022   K 3.7 12/17/2022   CL 105 12/17/2022   CREATININE 1.05 (H) 12/17/2022   BUN 15 12/17/2022   CO2 22 12/17/2022   TSH 2.72 05/06/2022   HGBA1C 5.8 05/06/2022    CT Renal Stone Study  Result Date: 12/17/2022 CLINICAL DATA:  Abdominal pain.  Possible stone EXAM: CT ABDOMEN AND PELVIS WITHOUT CONTRAST TECHNIQUE: Multidetector CT imaging of the abdomen and pelvis was performed following the standard protocol without IV contrast. RADIATION DOSE REDUCTION: This exam was performed according to the departmental dose-optimization program which includes automated exposure control, adjustment of the mA and/or kV according to patient size and/or use of iterative reconstruction technique. COMPARISON:  Renal stone CT 08/10/2022.  X-ray 10/22/2022 FINDINGS: Lower chest: Mild linear opacity lung bases likely scar or atelectasis. No pleural effusion. Hepatobiliary: Previous cholecystectomy. Otherwise the liver is grossly preserved on this noncontrast exam Pancreas: Unremarkable. No pancreatic ductal dilatation or surrounding inflammatory changes. Spleen: Normal in size without focal abnormality. Adrenals/Urinary Tract: The adrenal glands are preserved. 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 There are 2 punctate calcifications in the right kidney. Possible nonobstructing stones. Best seen on series 5, image 55 of the coronal data set. Left kidney has several small stones. Many of which are punctate. Larger 4 mm focus along the lower pole. New left-sided collecting system dilatation. The multiple stones along the left renal pelvis are now seen in the proximal left ureter. Largest focus is seen the most distal of these ureteral stones measuring 6 mm on series 5, image 54 of the coronal data set. Several smaller  foci identified just proximal. There are also small calcifications along the left UVJ on axial image 74 of series 2. Preserved contours of the urinary bladder. Stomach/Bowel: Large bowel is of normal course and caliber with scattered stool. Stomach and small bowel are nondilated. Normal appendix. Vascular/Lymphatic: Normal caliber aorta and IVC. No abnormal lymph node enlargement identified in the abdomen and pelvis. Reproductive: Lobular uterus again seen consistent with fibroids. No separate adnexal mass Other: No free air or free fluid. Musculoskeletal: Curvature of the lumbar spine. Mild degenerative changes. IMPRESSION: Developing moderate intrarenal collecting system dilatation. Previous stones in the renal pelvis are now seen along the proximal ureter. Largest stone in this location of 6 mm. Additional small stones at the left UVJ. Additional nonobstructing renal stones. Uterine fibroid Electronically Signed   By: Karen Kays M.D.   On: 12/17/2022 15:08    Assessment & Plan:  .Flank pain -     Urine Culture -     Urinalysis, Routine w reflex microscopic  Decreased GFR -     Basic metabolic panel -     Magnesium  Left nephrolithiasis Assessment & Plan: S/p lithotripsy of 16 mm left ureteral stone  on April 23,  placement of 24 cm left ureteral stent on April 27 secondary to recurrent episodes of severe flank pain and recurrent obstruction from a 1 cm stone fragment.  For stent removal on Tuesday. Advised to pretreat with tylenol (+/- NSAID pending review of cr today )., antispasmodic . She was prescribed Oxybutynin by urology,  I have given her hyoscyamine as a longer acting antispasmodic to take .    Acute renal failure, unspecified acute renal failure type [facility]) Assessment & Plan: Significant change in Cr was noted during her post  procedure ER visits   repeating today . Advised to suspend all NSAIDS until GFR returns to normal    Other orders -     Hyoscyamine Sulfate ER; Take 1  tablet (0.375 mg total) by mouth 2 (two) times daily.  Dispense: 60 tablet; Refill: 0     I provided 52 minutes of face-to-face time during this encounter reviewing patient's last visit with me, patient's  most recent visit with urology,  last 4 ER visits,   recent surgical and non surgical procedures, previous  labs and imaging studies, counseling on currently addressed issues,  and post visit ordering to diagnostics and therapeutics .   Follow-up: No follow-ups on file.   Sherlene Shams, MD

## 2022-12-23 NOTE — Assessment & Plan Note (Signed)
S/p lithotripsy of 16 mm left ureteral stone  on April 23,  placement of 24 cm left ureteral stent on April 27 secondary to recurrent episodes of severe flank pain and recurrent obstruction from a 1 cm stone fragment.  For stent removal on Tuesday. Advised to pretreat with tylenol (+/- NSAID pending review of cr today )., antispasmodic . She was prescribed Oxybutynin by urology,  I have given her hyoscyamine as a longer acting antispasmodic to take .

## 2022-12-23 NOTE — Assessment & Plan Note (Signed)
Significant change in Cr was noted during her post procedure ER visits   repeating today . Advised to suspend all NSAIDS until GFR returns to normal

## 2022-12-24 LAB — URINE CULTURE
MICRO NUMBER:: 14905125
Result:: NO GROWTH
SPECIMEN QUALITY:: ADEQUATE

## 2022-12-24 NOTE — Telephone Encounter (Signed)
Pt is aware and stated that she will wait to hear from Dr. Darrick Huntsman when she results her labs.

## 2022-12-24 NOTE — Telephone Encounter (Signed)
Is it okay to order a UA w/ micro and a culture

## 2022-12-28 DIAGNOSIS — N200 Calculus of kidney: Secondary | ICD-10-CM | POA: Diagnosis not present

## 2023-01-31 DIAGNOSIS — R944 Abnormal results of kidney function studies: Secondary | ICD-10-CM | POA: Diagnosis not present

## 2023-01-31 DIAGNOSIS — N200 Calculus of kidney: Secondary | ICD-10-CM | POA: Diagnosis not present

## 2023-10-06 DIAGNOSIS — Z1231 Encounter for screening mammogram for malignant neoplasm of breast: Secondary | ICD-10-CM | POA: Diagnosis not present

## 2023-10-06 DIAGNOSIS — Z006 Encounter for examination for normal comparison and control in clinical research program: Secondary | ICD-10-CM | POA: Diagnosis not present

## 2023-10-06 LAB — HM MAMMOGRAPHY

## 2023-10-13 DIAGNOSIS — Z872 Personal history of diseases of the skin and subcutaneous tissue: Secondary | ICD-10-CM | POA: Diagnosis not present

## 2023-10-13 DIAGNOSIS — L578 Other skin changes due to chronic exposure to nonionizing radiation: Secondary | ICD-10-CM | POA: Diagnosis not present

## 2023-10-13 DIAGNOSIS — Z86018 Personal history of other benign neoplasm: Secondary | ICD-10-CM | POA: Diagnosis not present

## 2023-11-08 ENCOUNTER — Ambulatory Visit: Admitting: Nurse Practitioner

## 2023-11-08 ENCOUNTER — Encounter: Payer: Self-pay | Admitting: Nurse Practitioner

## 2023-11-08 VITALS — BP 122/72 | HR 75 | Temp 97.8°F | Ht 65.0 in | Wt 193.4 lb

## 2023-11-08 DIAGNOSIS — R109 Unspecified abdominal pain: Secondary | ICD-10-CM

## 2023-11-08 DIAGNOSIS — R350 Frequency of micturition: Secondary | ICD-10-CM | POA: Diagnosis not present

## 2023-11-08 LAB — POC URINALSYSI DIPSTICK (AUTOMATED)
Bilirubin, UA: NEGATIVE
Blood, UA: NEGATIVE
Glucose, UA: NEGATIVE
Ketones, UA: NEGATIVE
Nitrite, UA: NEGATIVE
Protein, UA: NEGATIVE
Spec Grav, UA: 1.015 (ref 1.010–1.025)
Urobilinogen, UA: 0.2 U/dL
pH, UA: 7.5 (ref 5.0–8.0)

## 2023-11-08 NOTE — Progress Notes (Unsigned)
 Established Patient Office Visit  Subjective:  Patient ID: Amy Oliver, female    DOB: Jan 20, 1961  Age: 63 y.o. MRN: [id number]  CC:  Chief Complaint  Patient presents with  . Acute Visit    Kidney problem- frequent urination    HPI  Amy Oliver presents for:  HPI   Past Medical History:  Diagnosis Date  . Family history of adverse reaction to anesthesia    Mother had some breathing issues, 1 time, "A long time ago"  . History of nephrolithiasis   . Mass of perineum, female 03/24/2018    Past Surgical History:  Procedure Laterality Date  . 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 Stereotactic biopsy clip not removed during the procedure. Patient declined further surgical intervention. Follow-up at [facility]..  . CESAREAN SECTION     2  . CHOLECYSTECTOMY  2007  . COLONOSCOPY WITH PROPOFOL N/A 08/07/2018   Procedure: COLONOSCOPY WITH PROPOFOL;  Surgeon: Toney Reil, MD;  Location: [facility] END[facility]COPY;  Service: Gastroenterology;  Laterality: N/A;  . COLONOSCOPY WITH PROPOFOL N/A 05/04/2022   Procedure: COLONOSCOPY WITH PROPOFOL;  Surgeon: Toney Reil, MD;  Location: [facility];  Service: Endoscopy;  Laterality: N/A;  . LIPOMA EXCISION  2019   labia  . TONSILLECTOMY      Family History  Problem Relation Age of Onset  . Hypertension Father   . Pulmonary fibrosis Father   . Diabetes Brother   . Breast cancer Paternal Grandmother   . Lung cancer Paternal Aunt   . Breast cancer Maternal Aunt     Social History   Socioeconomic History  . Marital status: Married    Spouse name: Not on file  . Number of children: Not on file  . Years of education: Not on file  . Highest education level: Bachelor's degree (e.g., BA, AB, BS)  Occupational History  . Occupation: Engineer, maintenance (IT): [employer]    Comment: non Heritage manager  Tobacco Use  .  Smoking status: Never  . Smokeless tobacco: Never  Vaping Use  . Vaping status: Never Used  Substance and Sexual Activity  . Alcohol use: Yes    Comment: rare  . Drug use: No  . Sexual activity: Not on file  Other Topics Concern  . Not on file  Social History Narrative  . Not on file   Social Drivers of Health   Financial Resource Strain: Low Risk  (11/07/2023)   Overall Financial Resource Strain (CARDIA)   . Difficulty of Paying Living Expenses: Not hard at all  Food Insecurity: No Food Insecurity (11/07/2023)   Hunger Vital Sign   . Worried About Programme researcher, broadcasting/film/video in the Last Year: Never true   . Ran Out of Food in the Last Year: Never true  Transportation Needs: No Transportation Needs (11/07/2023)   PRAPARE - Transportation   . Lack of Transportation (Medical): No   . Lack of Transportation (Non-Medical): No  Physical Activity: Insufficiently Active (11/07/2023)   Exercise Vital Sign   . Days of Exercise per Week: 2 days   . Minutes of Exercise per Session: 20 min  Stress: No Stress Concern Present (11/07/2023)   [employer] of Occupational Health - Occupational Stress Questionnaire   . Feeling of Stress : Only a little  Social Connections: Socially Integrated (11/07/2023)   Social Connection and Isolation Panel [NHANES]   . Frequency of Communication  with Friends and Family: Once a week   . Frequency of Social Gatherings with Friends and Family: Twice a week   . Attends Religious Services: 1 to 4 times per year   . Active Member of Clubs or Organizations: Yes   . Attends Banker Meetings: 1 to 4 times per year   . Marital Status: Married  Catering manager Violence: Not At Risk (12/18/2022)   Received from [facility]   Humiliation, Afraid, Rape, and Kick questionnaire   . Fear of Current or Ex-Partner: No   . Emotionally Abused: No   . Physically Abused: No   . Sexually Abused: No     Outpatient Medications Prior to Visit   Medication Sig Dispense Refill  . acetaminophen (TYLENOL) 500 MG tablet Take 500 mg by mouth every 6 (six) hours as needed.    . Ascorbic Acid (VITAMIN C ER PO) Take by mouth.    . B COMPLEX-BIOTIN-FA PO Take 5 tablets by mouth daily.    Marland Kitchen BLACK CURRANT SEED OIL PO Take 4 capsules by mouth daily.    . Coenzyme Q10 (COQ10) 100 MG CAPS Take 1 tablet by mouth daily.    . Difluprednate 0.05 % EMUL Apply 2 drops to eye 3 (three) times daily as needed.    . hyoscyamine (LEVBID) 0.375 MG 12 hr tablet Take 1 tablet (0.375 mg total) by mouth 2 (two) times daily. 60 tablet 0  . ibuprofen (ADVIL) 600 MG tablet Take 600 mg by mouth every 6 (six) hours as needed.    . Multiple Vitamins-Minerals (ZINC PO) Take by mouth.    . ondansetron (ZOFRAN-ODT) 4 MG disintegrating tablet Take 4 mg by mouth every 8 (eight) hours as needed.    . Quercetin 500 MG CAPS Take 1 capsule by mouth daily.    . tamsulosin (FLOMAX) 0.4 MG CAPS capsule Take 0.4 mg by mouth daily.    . Turmeric (CURCUMIN 95) 500 MG CAPS Take 5 tablets by mouth daily.    Marland Kitchen UNABLE TO FIND Take 3 tablets by mouth daily. Med Name: Beta-Plus    . UNABLE TO FIND Take 1 tablet by mouth daily. Med Name: Iodomere 200 mg    . UNABLE TO FIND Take 10 drops by mouth daily. Med Name: Phos Food Liquid     No facility-administered medications prior to visit.    Allergies  Allergen Reactions  . Penicillins Rash    ROS Review of Systems Negative unless indicated in HPI.    Objective:    Physical Exam  BP 122/72   Pulse 75   Temp 97.8 F (36.6 C)   Ht 5\' 5"  (1.651 m)   Wt 193 lb 6.4 oz (87.7 kg)   LMP 09/18/2015   SpO2 97%   BMI 32.18 kg/m  Wt Readings from Last 3 Encounters:  11/08/23 193 lb 6.4 oz (87.7 kg)  12/23/22 202 lb 9.6 oz (91.9 kg)  12/17/22 207 lb (93.9 kg)     Health Maintenance  Topic Date Due  . HIV Screening  Never done  . Hepatitis C Screening  Never done  . Zoster Vaccines- Shingrix (1 of 2) Never done  .  DTaP/Tdap/Td (2 - Td or Tdap) 09/29/2023  . INFLUENZA VACCINE  11/21/2023 (Originally 03/24/2023)  . COVID-19 Vaccine (4 - 2024-25 season) 11/23/2023 (Originally 04/24/2023)  . Colonoscopy  05/04/2025  . Cervical Cancer Screening (HPV/Pap Cotest)  07/30/2025  . HPV VACCINES  Aged Out  .  MAMMOGRAM  Discontinued    There are no preventive care reminders to display for this patient.  Lab Results  Component Value Date   TSH 2.72 05/06/2022   Lab Results  Component Value Date   WBC 10.9 (H) 12/17/2022   HGB 12.8 12/17/2022   HCT 39.0 12/17/2022   MCV 89.4 12/17/2022   PLT 377 12/17/2022   Lab Results  Component Value Date   NA 141 12/23/2022   K 3.8 12/23/2022   CO2 26 12/23/2022   GLUCOSE 102 (H) 12/23/2022   BUN 9 12/23/2022   CREATININE 0.75 12/23/2022   BILITOT 1.1 12/16/2022   ALKPHOS 68 12/16/2022   AST 19 12/16/2022   ALT 15 12/16/2022   PROT 7.4 12/16/2022   ALBUMIN 4.0 12/16/2022   CALCIUM 9.6 12/23/2022   ANIONGAP 9 12/17/2022   EGFR 85 05/06/2022   GFR 87.01 12/23/2022   Lab Results  Component Value Date   CHOL 279 (A) 05/06/2022   Lab Results  Component Value Date   HDL 48.70 10/01/2019   Lab Results  Component Value Date   LDLCALC 218 05/06/2022   Lab Results  Component Value Date   TRIG 117 05/06/2022   Lab Results  Component Value Date   CHOLHDL 5 10/01/2019   Lab Results  Component Value Date   HGBA1C 5.8 05/06/2022      Assessment & Plan:  Urine frequency -     POCT Urinalysis Dipstick (Automated) -     Urine Culture -     Urine Microscopic    Follow-up: No follow-ups on file.   Kara Dies, NP

## 2023-11-08 NOTE — Patient Instructions (Signed)
 Please go to the lab for blood work

## 2023-11-09 LAB — BASIC METABOLIC PANEL
BUN: 14 mg/dL (ref 6–23)
CO2: 29 meq/L (ref 19–32)
Calcium: 9.7 mg/dL (ref 8.4–10.5)
Chloride: 106 meq/L (ref 96–112)
Creatinine, Ser: 0.83 mg/dL (ref 0.40–1.20)
GFR: 76.57 mL/min (ref >60.00)
Glucose, Bld: 86 mg/dL (ref 70–99)
Potassium: 4.3 meq/L (ref 3.5–5.1)
Sodium: 141 meq/L (ref 135–145)

## 2023-11-09 LAB — MICROALBUMIN / CREATININE URINE RATIO
Creatinine,U: 77.7 mg/dL
Microalb Creat Ratio: UNDETERMINED mg/g (ref 0.0–30.0)
Microalb, Ur: <0.7 mg/dL

## 2023-11-09 LAB — URINE CULTURE
MICRO NUMBER:: 16215327
SPECIMEN QUALITY:: ADEQUATE

## 2023-11-09 LAB — URINALYSIS, MICROSCOPIC ONLY: RBC / HPF: NONE SEEN (ref 0–?)

## 2023-11-10 DIAGNOSIS — R109 Unspecified abdominal pain: Secondary | ICD-10-CM | POA: Insufficient documentation

## 2023-11-10 NOTE — Assessment & Plan Note (Signed)
 Persistent left-sided flank pain post-lithotripsy last year more on the left side. She was seeing chiropractor for the pain. -Urinalysis negative for nitrite or hematuria, positive small leucocytes.  -Ordered CT Renal stone study. -Ordered test as outlined.

## 2023-11-11 ENCOUNTER — Encounter: Payer: Self-pay | Admitting: Nurse Practitioner

## 2023-11-16 ENCOUNTER — Ambulatory Visit
Admission: RE | Admit: 2023-11-16 | Discharge: 2023-11-16 | Disposition: A | Discharge status: Home / Self Care | Source: Ambulatory Visit | Attending: Nurse Practitioner | Admitting: Nurse Practitioner

## 2023-11-16 DIAGNOSIS — R109 Unspecified abdominal pain: Secondary | ICD-10-CM | POA: Insufficient documentation

## 2023-11-16 DIAGNOSIS — R310 Gross hematuria: Secondary | ICD-10-CM | POA: Diagnosis not present

## 2023-11-16 DIAGNOSIS — D259 Leiomyoma of uterus, unspecified: Secondary | ICD-10-CM | POA: Diagnosis not present

## 2023-11-29 NOTE — Progress Notes (Signed)
 No stone seen in the scan. Have uterine fibroids. Would recommend to see Gyn for evaluation. Does she sees GYN or needs referral.

## 2024-01-30 DIAGNOSIS — Z803 Family history of malignant neoplasm of breast: Secondary | ICD-10-CM | POA: Diagnosis not present

## 2024-01-30 DIAGNOSIS — N6091 Unspecified benign mammary dysplasia of right breast: Secondary | ICD-10-CM | POA: Diagnosis not present

## 2024-01-30 DIAGNOSIS — Z9189 Other specified personal risk factors, not elsewhere classified: Secondary | ICD-10-CM | POA: Diagnosis not present

## 2024-02-09 ENCOUNTER — Telehealth: Payer: Self-pay

## 2024-02-09 NOTE — Telephone Encounter (Signed)
 Patient stated her mammogram was done at [facility] so I abstracted from [facility] - normal mammogram on 10/06/23.

## 2024-03-06 DIAGNOSIS — D259 Leiomyoma of uterus, unspecified: Secondary | ICD-10-CM | POA: Diagnosis not present

## 2024-03-08 DIAGNOSIS — D259 Leiomyoma of uterus, unspecified: Secondary | ICD-10-CM | POA: Diagnosis not present

## 2024-04-03 DIAGNOSIS — N952 Postmenopausal atrophic vaginitis: Secondary | ICD-10-CM | POA: Diagnosis not present

## 2024-04-03 DIAGNOSIS — R9389 Abnormal findings on diagnostic imaging of other specified body structures: Secondary | ICD-10-CM | POA: Diagnosis not present

## 2024-05-08 DIAGNOSIS — Z0189 Encounter for other specified special examinations: Secondary | ICD-10-CM | POA: Diagnosis not present

## 2024-05-08 DIAGNOSIS — Z7184 Encounter for health counseling related to travel: Secondary | ICD-10-CM | POA: Diagnosis not present

## 2024-07-13 DIAGNOSIS — W57XXXA Bitten or stung by nonvenomous insect and other nonvenomous arthropods, initial encounter: Secondary | ICD-10-CM | POA: Diagnosis not present

## 2024-07-13 DIAGNOSIS — R21 Rash and other nonspecific skin eruption: Secondary | ICD-10-CM | POA: Diagnosis not present

## 2024-07-24 DIAGNOSIS — N840 Polyp of corpus uteri: Secondary | ICD-10-CM | POA: Diagnosis not present

## 2024-07-24 DIAGNOSIS — R9389 Abnormal findings on diagnostic imaging of other specified body structures: Secondary | ICD-10-CM | POA: Diagnosis not present

## 2024-07-24 DIAGNOSIS — Z880 Allergy status to penicillin: Secondary | ICD-10-CM | POA: Diagnosis not present

## 2024-07-24 DIAGNOSIS — Z6833 Body mass index (BMI) 33.0-33.9, adult: Secondary | ICD-10-CM | POA: Diagnosis not present

## 2024-07-24 DIAGNOSIS — N898 Other specified noninflammatory disorders of vagina: Secondary | ICD-10-CM | POA: Diagnosis not present

## 2024-07-24 DIAGNOSIS — E66811 Obesity, class 1: Secondary | ICD-10-CM | POA: Diagnosis not present
